# Patient Record
Sex: Female | Born: 1975 | Race: Black or African American | Hispanic: No | Marital: Married | Smoking: Never smoker
Health system: Southern US, Community
[De-identification: ages and names within clinical notes are randomized; demographics above are authoritative.]

## PROBLEM LIST (undated history)

## (undated) DIAGNOSIS — Z973 Presence of spectacles and contact lenses: Secondary | ICD-10-CM

## (undated) DIAGNOSIS — D649 Anemia, unspecified: Secondary | ICD-10-CM

## (undated) DIAGNOSIS — D259 Leiomyoma of uterus, unspecified: Secondary | ICD-10-CM

## (undated) HISTORY — PX: MOUTH SURGERY: SHX715

## (undated) HISTORY — PX: BREAST BIOPSY: SHX20

---

## 1998-05-06 ENCOUNTER — Other Ambulatory Visit: Admission: RE | Admit: 1998-05-06 | Discharge: 1998-05-06 | Payer: Self-pay | Admitting: Obstetrics and Gynecology

## 1999-07-08 ENCOUNTER — Other Ambulatory Visit: Admission: RE | Admit: 1999-07-08 | Discharge: 1999-07-08 | Payer: Self-pay | Admitting: Obstetrics and Gynecology

## 2001-02-01 ENCOUNTER — Other Ambulatory Visit: Admission: RE | Admit: 2001-02-01 | Discharge: 2001-02-01 | Payer: Self-pay | Admitting: Obstetrics and Gynecology

## 2002-02-06 ENCOUNTER — Other Ambulatory Visit: Admission: RE | Admit: 2002-02-06 | Discharge: 2002-02-06 | Payer: Self-pay | Admitting: Obstetrics and Gynecology

## 2003-01-15 ENCOUNTER — Inpatient Hospital Stay (HOSPITAL_COMMUNITY): Admission: AD | Admit: 2003-01-15 | Discharge: 2003-01-18 | Payer: Self-pay | Admitting: *Deleted

## 2003-02-19 ENCOUNTER — Other Ambulatory Visit: Admission: RE | Admit: 2003-02-19 | Discharge: 2003-02-19 | Payer: Self-pay | Admitting: *Deleted

## 2008-10-30 HISTORY — PX: ORIF ANKLE FRACTURE: SHX5408

## 2009-05-27 ENCOUNTER — Inpatient Hospital Stay (HOSPITAL_COMMUNITY): Admission: EM | Admit: 2009-05-27 | Discharge: 2009-05-29 | Payer: Self-pay | Admitting: Emergency Medicine

## 2010-06-22 ENCOUNTER — Encounter: Admission: RE | Admit: 2010-06-22 | Discharge: 2010-09-20 | Payer: Self-pay | Admitting: Family Medicine

## 2010-10-12 ENCOUNTER — Encounter: Admit: 2010-10-12 | Payer: Self-pay | Admitting: Family Medicine

## 2011-02-05 LAB — PREGNANCY, URINE: Preg Test, Ur: NEGATIVE

## 2011-02-05 LAB — CBC
HCT: 33.3 % — ABNORMAL LOW (ref 36.0–46.0)
MCHC: 34.3 g/dL (ref 30.0–36.0)
MCV: 84 fL (ref 78.0–100.0)
Platelets: 296 10*3/uL (ref 150–400)
Platelets: 366 10*3/uL (ref 150–400)
RBC: 3.97 MIL/uL (ref 3.87–5.11)
WBC: 10.7 10*3/uL — ABNORMAL HIGH (ref 4.0–10.5)
WBC: 13.7 10*3/uL — ABNORMAL HIGH (ref 4.0–10.5)

## 2011-02-05 LAB — URINALYSIS, ROUTINE W REFLEX MICROSCOPIC
Bilirubin Urine: NEGATIVE
Glucose, UA: NEGATIVE mg/dL
Ketones, ur: 15 mg/dL — AB
Nitrite: NEGATIVE

## 2011-02-05 LAB — URINE MICROSCOPIC-ADD ON

## 2011-02-05 LAB — BASIC METABOLIC PANEL
BUN: 10 mg/dL (ref 6–23)
Creatinine, Ser: 0.86 mg/dL (ref 0.4–1.2)
GFR calc non Af Amer: >60 mL/min (ref >60)
Potassium: 3.7 mEq/L (ref 3.5–5.1)

## 2011-02-05 LAB — DIFFERENTIAL
Lymphocytes Relative: 12 % (ref 12–46)
Lymphs Abs: 1.6 10*3/uL (ref 0.7–4.0)
Neutrophils Relative %: 82 % — ABNORMAL HIGH (ref 43–77)

## 2011-02-05 LAB — PROTIME-INR
INR: 1 (ref 0.00–1.49)
Prothrombin Time: 13.6 seconds (ref 11.6–15.2)

## 2011-02-05 LAB — URINE CULTURE

## 2011-03-14 NOTE — Op Note (Signed)
NAMEMarland Kitchen  Lisa Shepard, Lisa Shepard            ACCOUNT NO.:  [id number]   MEDICAL RECORD NO.:  [id number]          PATIENT TYPE:  OBV   LOCATION:  5023                         FACILITY:  [facility]   PHYSICIAN:  Alvy Beal, MD    DATE OF BIRTH:  01/30/1976   DATE OF PROCEDURE:  DATE OF DISCHARGE:                               OPERATIVE REPORT   PREOPERATIVE DIAGNOSIS:  Left bimalleolar ankle fracture.   POSTOPERATIVE DIAGNOSIS:  Left bimalleolar ankle fracture.   OPERATIVE PROCEDURE:  Open reduction and internal fixation of the ankle.   COMPLICATIONS:  None.   SURGEON:  Dahari D. Shon Baton, MD   FIRST ASSISTANT:  Crissie Reese, PA.   HISTORY:  This is a very pleasant 35 year old woman who fell yesterday  evening while roller derbing.  I admitted her for definitive fracture  fixation.  All appropriate risks, benefits, and alternatives were  discussed.  Consent was obtained.   OPERATIVE NOTE:  The patient was brought to the operating room, placed  supine on the operating table.  After successful induction of laryngeal  mask anesthesia and laryngeal mask intubation, a bump was placed under  the left thigh.  The tourniquet was placed on the left proximal thigh,  and the left lower extremity was prepped and draped in a standard  fashion.  Appropriate red time-out was done confirming patient,  procedure, extremity, and fracture.  Once this was confirmed, I then  exsanguinated the extremity with 6-inch wrap and then inflated the  tourniquet to 300 mmHg (total tourniquet time 57 minutes).  Lateral  incision was made over the distal in line of the fibula.  Sharp  dissection was carried out down to the deep subcutaneous tissue to the  underlying fascia.  I sharply dissected with a Metzenbaum scissors to  expose the lateral aspect of the fibula.  I was able to clearly  visualize the latter to be oblique fracture.  The fracture went from  posterosuperior to anteroinferior.  Once I had adequate  exposure  proximal to and distal to the fracture along the lateral aspect of the  fibula and I could visualize the anterior and posterior portions of the  fibula, I proceeded with placing of lag screw.   At this point, I extenuated the fracture fragment and used a fine  curette and dental hook to remove any entrapped hematoma and bony  spicules from the fracture site.  I used a #15 blade scalpel to remove  overlying enfolded periosteum.  At this point, I took a reduction clamp,  applied to the lateral aspect of the fibula.  I had an adequate  satisfactory reduction.   I confirmed the reduction with AP, mortise, and lateral x-ray views.  Once confirmed, I then took a 3.5 drill and drilled the proximal cortex.  I then placed a 3.5 drill through this hole and drilled through the  distal cortex.  I measured and took a 20-mm cortical screw and used as a  lag screw across this and I had excellent purchase.  I was able to  remove the reduction forceps and the fracture remained reduced.  I  again  checked the reduction, hardware placement in the AP, mortise, and  lateral planes.  Once confirmed, I then took a 7-hole one-third Synthes  tubular plate and secured it to the lateral aspect of the fibula and 2  distal screws that were cancellus unicortical screws and 3 proximal 14-  mm cortical screws.  I had excellent purchase.  All screws were  tightened appropriately.  I irrigated the wound copiously with normal  saline, and I then took final intraoperative fluoro views.  The patient  had satisfactory reduction of the lateral fragment and then indirectly  the posterior fragment reduced as well.  I stressed the fibula to test  the syndesmosis and it was intact and there was better reduction of the  medial clear space.  At this point with the reductions anatomic and the  hardware in satisfactory position, I irrigated copiously with normal  saline and then closed in a layered fashion with 0  interrupted Vicryl, 2-  0 interrupted Vicryl, and a 3-0 Prolene vertical mattress running suture  for the skin.  A bulky dressing was applied as was a posterior splint  with side struts.  The tourniquet was released.  Prior to applying  dressings, I did anesthetize the given intra-articular 5 mL plain  lidocaine injection and then injected just cranial to the incision.  Then the dressing and splint was applied.  The patient was extubated,  transferred to the PACU without incident.  At the end of the case, all  needle and sponge counts were correct.  The patient tolerated the  procedure well.  No intraoperative complications.      Alvy Beal, MD  Electronically Signed     DDB/MEDQ  D:  05/28/2009  T:  05/29/2009  Job:  [id number]

## 2011-03-14 NOTE — H&P (Signed)
NAMEMarland Shepard  Lisa, Shepard            ACCOUNT NO.:  [id number]   MEDICAL RECORD NO.:  [id number]          PATIENT TYPE:  OBV   LOCATION:  5023                         FACILITY:  [facility]   PHYSICIAN:  Alvy Beal, MD    DATE OF BIRTH:  [birth year]   DATE OF ADMISSION:  05/27/2009  DATE OF DISCHARGE:                              HISTORY & PHYSICAL   ADMITTING DIAGNOSIS:  Left ankle fracture (bimalleolar).   HISTORY:  This is a very pleasant active 35 year old woman, who is an  employed here at [employer].  She was in her usual  state of good-to-excellent health without significant medical issues  until she injured herself this evening.  She was at a rollerblading  competition, lost her footing and fell.  She noted immediate pain and  swelling of the left ankle.  She was brought to the emergency room here  at [facility] where x-rays were done, which demonstrated a bimalleolar  ankle fracture.  As a result, orthopedic consultation was requested.   The patient's past medical history is essentially unremarkable.   ALLERGIES:  She has adverse drug reaction to CODEINE, but otherwise, no  other allergies.   She denies a history of hypertension, diabetes, coronary artery disease.   She is on no current medications.   CLINICAL EXAM:  GENERAL:  She is a pleasant woman appears to her stated  age, in no acute distress.  She is alert and oriented x3.  No shortness  of breath or chest pain.  ABDOMEN:  Soft and nontender.  NEUROLOGIC:  Cranial nerves II through XII tested and they are intact.  EXTREMITIES:  Thigh compartments are soft, nontender.  Proximal calf is  soft and nontender.  She is moving all her toes.  Capillary refill less  than 2 seconds, and she has got intact sensation to light touch.  She  has a posterior splint with side struts applied, and the ankle was  comfortable, is currently elevated.   At this point in time, the x-rays do demonstrate a bimalleolar  ankle  fracture.  There is widening of the medial side, a small posterior  fragment and a Weber B lateral malleolar fracture.   I have had a frank and lengthy discussion with the patient, and we  discussed treatment options which include nonsurgical and surgical  management.  At this point I think because of the medial widening, the  best option is to fix the lateral side and if required after stressing,  a syndesmotic screw.  I went over the risks with her which include  infection, bleeding, nerve damage, death, stroke, paralysis, failure to  heal, need for further surgery, ongoing or worse pain, arthritis.  All  of her questions were addressed.  We will plan on performing surgery  tomorrow.  She is currently n.p.o., on IV fluids.      Alvy Beal, MD  Electronically Signed     DDB/MEDQ  D:  05/28/2009  T:  05/28/2009  Job:  [id number]

## 2013-10-30 HISTORY — PX: BREAST EXCISIONAL BIOPSY: SUR124

## 2014-02-02 ENCOUNTER — Other Ambulatory Visit: Payer: Self-pay | Admitting: Family

## 2014-02-02 DIAGNOSIS — Z1231 Encounter for screening mammogram for malignant neoplasm of breast: Secondary | ICD-10-CM

## 2014-02-11 ENCOUNTER — Other Ambulatory Visit: Payer: Self-pay | Admitting: Family

## 2014-02-11 ENCOUNTER — Ambulatory Visit
Admission: RE | Admit: 2014-02-11 | Discharge: 2014-02-11 | Disposition: A | Discharge status: Home / Self Care | Payer: BC Managed Care – PPO | Source: Ambulatory Visit | Attending: Family | Admitting: Family

## 2014-02-11 DIAGNOSIS — Z1231 Encounter for screening mammogram for malignant neoplasm of breast: Secondary | ICD-10-CM

## 2014-02-17 ENCOUNTER — Other Ambulatory Visit: Payer: Self-pay | Admitting: Family

## 2014-02-17 DIAGNOSIS — R928 Other abnormal and inconclusive findings on diagnostic imaging of breast: Secondary | ICD-10-CM

## 2014-02-26 ENCOUNTER — Ambulatory Visit
Admission: RE | Admit: 2014-02-26 | Discharge: 2014-02-26 | Disposition: A | Discharge status: Home / Self Care | Payer: BC Managed Care – PPO | Source: Ambulatory Visit | Attending: Family | Admitting: Family

## 2014-02-26 ENCOUNTER — Encounter (INDEPENDENT_AMBULATORY_CARE_PROVIDER_SITE_OTHER): Payer: Self-pay

## 2014-02-26 DIAGNOSIS — R928 Other abnormal and inconclusive findings on diagnostic imaging of breast: Secondary | ICD-10-CM

## 2014-07-24 ENCOUNTER — Other Ambulatory Visit: Payer: Self-pay | Admitting: Family

## 2014-07-24 DIAGNOSIS — N6489 Other specified disorders of breast: Secondary | ICD-10-CM

## 2014-08-21 ENCOUNTER — Ambulatory Visit
Admission: RE | Admit: 2014-08-21 | Discharge: 2014-08-21 | Disposition: A | Discharge status: Home / Self Care | Payer: BC Managed Care – PPO | Source: Ambulatory Visit | Attending: Family | Admitting: Family

## 2014-08-21 ENCOUNTER — Other Ambulatory Visit: Payer: Self-pay | Admitting: Family

## 2014-08-21 ENCOUNTER — Encounter (INDEPENDENT_AMBULATORY_CARE_PROVIDER_SITE_OTHER): Payer: Self-pay

## 2014-08-21 DIAGNOSIS — N6489 Other specified disorders of breast: Secondary | ICD-10-CM

## 2014-08-31 ENCOUNTER — Other Ambulatory Visit: Payer: Self-pay | Admitting: Family

## 2014-08-31 DIAGNOSIS — N6489 Other specified disorders of breast: Secondary | ICD-10-CM

## 2014-09-02 ENCOUNTER — Ambulatory Visit
Admission: RE | Admit: 2014-09-02 | Discharge: 2014-09-02 | Disposition: A | Discharge status: Home / Self Care | Payer: BC Managed Care – PPO | Source: Ambulatory Visit | Attending: Family | Admitting: Family

## 2014-09-02 DIAGNOSIS — N6489 Other specified disorders of breast: Secondary | ICD-10-CM

## 2014-09-15 ENCOUNTER — Ambulatory Visit (INDEPENDENT_AMBULATORY_CARE_PROVIDER_SITE_OTHER): Payer: Self-pay | Admitting: Surgery

## 2014-09-15 NOTE — H&P (Signed)
Lisa Shepard 09/15/2014 11:17 AM Location: [facility] Patient #: [id number] DOB: Sep 19, 1976 Divorced / Language: [name] / Race: Black or African American Female History of Present Illness [name] A. Ayad Nieman MD; 09/15/2014 12:26 PM) Patient words: eval left breast  pt sent at the request of Dr Joneen Caraway of the [facility] for left breast mass and discordant mammogram. Pt has has left subareolar mass for last year. This has been followed and was recently biopsied. PATH SHOWED FIBROCYSTIC DISEASE BUT THIS WAS FELT TO BE DISCORDANT.  CLINICAL DATA: Short-term (6 month) interval followup of likely benign complex cyst in the subareolar left breast and focal asymmetry in the outer left breast.  EXAM: DIGITAL DIAGNOSTIC LEFT MAMMOGRAM WITH CAD  ULTRASOUND LEFT BREAST  COMPARISON: Mammography 02/26/2014 (left), 02/11/2014 (bilateral. Left breast ultrasound 02/26/2014.  ACR Breast Density Category c: The breast tissue is heterogeneously dense, which may obscure small masses.  FINDINGS: CC and MLO views of the left breast were obtained. The focal asymmetry in the outer left breast, middle depth, visualized only on the CC view is unchanged. The diagnostic mammogram 6 months ago indicated this just represented an [place] of fibroglandular tissue. The circumscribed, partially obscured mass in the subareolar left breast is unchanged in appearance. No new or suspicious findings elsewhere in the left breast.  Mammographic images were processed with CAD.  On physical exam, there is no palpable abnormality in the periareolar left breast.  Ultrasound is performed, showing the previously identified oval-shaped circumscribed hypoechoic mass with acoustic enhancement at the 9 o'clock subareolar left breast measuring approximately 6 x 4 x 6 mm, without internal color Doppler flow. At the 7 o'clock subareolar left breast is a horizontally oriented hypoechoic mass with lobular margins  measuring approximately 7 x 4 x 6 mm, with acoustic enhancement, demonstrating internal color Doppler flow.  IMPRESSION: 1. Approximate 7 mm mass at the 7 o'clock subareolar left breast, not visualized on the prior ultrasound, demonstrating internal color Doppler flow. Differential diagnosis would include intraductal papilloma, fibroadenoma, and less likely malignancy. 2. Stable likely benign 6 mm complex cyst at the 9 o'clock subareolar left breast. 3. Focal asymmetry in the outer left breast, middle depth, felt to represent an [place] of normal fibroglandular tissue.  RECOMMENDATION: 1. Ultrasound-guided core needle biopsy of the mass at the 7 o'clock subareolar left breast. 2. Bilateral diagnostic mammography with left breast ultrasound in 6 months to confirm stability of the likely benign complex cyst at the 9 o'clock subareolar left breast.  I discussed with the patient the fact that if this is indeed an intraductal papilloma, it would require surgical excision as it is considered a high risk lesion. The ultrasound biopsy procedure was discussed in detail with the patient. All of her questions were answered. She has agreed to proceed and at her request, the biopsy has been scheduled for November 4.  I have discussed the findings and recommendations with the patient. Results were also provided in writing at the conclusion of the visit.  BI-RADS CATEGORY 4: Suspicious.   Electronically Signed By: Evangeline Dakin M.D. On: 08/21/2014 08:40    Pathology revealed fibrocystic changes in the left breast at 7:00. This was found to be discordant with imaging by Dr. Enrique Sack. Pathology was discussed with the patient by telephone. She reported doing well after the biopsy with tenderness at the site. Post biopsy instructions were reviewed and her questions were answered. She was encouraged to call [facility] [place] for any additional concerns. Surgical  consultation has been arranged with Dr. Erroll Luna at [facility] on September 15, 2014. The patient is also interested in having a complex cyst removed in the left breast at 9:00. She is asked to return in 6 months for left breast ultrasound.  Pathology results reported by Susa Raring RN, BSN on September 03, 2014.   Electronically Signed By: Enrique Sack M.D. On: 09/03/2014 11:01   Breast, left, needle core biopsy, mass, 7 o'clock retroareolar - FIBROCYSTIC CHANGES. - THERE IS NO EVIDENCE OF MALIGNANCY. - SEE COMMENT.  The patient is a 38 year old female who presents with a breast mass. The patient is being seen for initial consultation for suspected neoplasm in the left breast that was first noted 12 month(s) ago. The patient was referred by a specialty consultant. Initial presentation was 12 month(s) ago with breast mass on self-examination. Evaluation to date has included mammography, BREAST ultrasound and core needle biopsy. Findings on mammography include an irregular mass. Classification based on mammographic findings is BiRads Class IV (suspicious). Ultrasound shows a complex mass. Cytology/histopathology is nondiagnostic. Other Problems Marjean Donna, CMA; 09/15/2014 11:50 AM) No pertinent past medical history  Past Surgical History Marjean Donna, [place]; 09/15/2014 11:50 AM) Breast Biopsy Left. Foot Surgery Left.  Diagnostic Studies History Marjean Donna, [place]; 09/15/2014 11:50 AM) Colonoscopy never Mammogram within last year Pap Smear 1-5 years ago  Allergies [name], [place]; 09/15/2014 11:50 AM) No Known Drug Allergies 09/15/2014  Medication History (Sonya Bynum, CMA; 09/15/2014 11:50 AM) No Current Medications  Social History Marjean Donna, [place]; 09/15/2014 11:50 AM) Alcohol use Occasional alcohol use. Caffeine use Carbonated beverages, Coffee. No drug use Tobacco use Never smoker.  Family History Marjean Donna, [place];  09/15/2014 11:50 AM) Cerebrovascular Accident Father. Colon Cancer Father. Diabetes Mellitus Father. Hypertension Father, Mother. Thyroid problems Father.  Pregnancy / Birth History Marjean Donna, [place]; 09/15/2014 11:50 AM) Age at menarche 45 years. Contraceptive History Contraceptive implant. Gravida 1 Maternal age 73-30 Para 1 Regular periods     Review of Systems ([place]; 09/15/2014 11:50 AM) General Not Present- Appetite Loss, Chills, Fatigue, Fever, Night Sweats, Weight Gain and Weight Loss. Skin Not Present- Change in Wart/Mole, Dryness, Hives, Jaundice, New Lesions, Non-Healing Wounds, Rash and Ulcer. HEENT Not Present- Earache, Hearing Loss, Hoarseness, Nose Bleed, Oral Ulcers, Ringing in the Ears, Seasonal Allergies, Sinus Pain, Sore Throat, Visual Disturbances, Wears glasses/contact lenses and Yellow Eyes. Respiratory Not Present- Bloody sputum, Chronic Cough, Difficulty Breathing, Snoring and Wheezing. Breast Present- Breast Mass. Not Present- Breast Pain, Nipple Discharge and Skin Changes. Cardiovascular Not Present- Chest Pain, Difficulty Breathing Lying Down, Leg Cramps, Palpitations, Rapid Heart Rate, Shortness of Breath and Swelling of Extremities. Gastrointestinal Not Present- Abdominal Pain, Bloating, Bloody Stool, Change in Bowel Habits, Chronic diarrhea, Constipation, Difficulty Swallowing, Excessive gas, Gets full quickly at meals, Hemorrhoids, Indigestion, Nausea, Rectal Pain and Vomiting. Female Genitourinary Not Present- Frequency, Nocturia, Painful Urination, Pelvic Pain and Urgency. Musculoskeletal Not Present- Back Pain, Joint Pain, Joint Stiffness, Muscle Pain, Muscle Weakness and Swelling of Extremities. Neurological Present- Headaches. Not Present- Decreased Memory, Fainting, Numbness, Seizures, Tingling, Tremor, Trouble walking and Weakness. Psychiatric Not Present- Anxiety, Bipolar, Change in Sleep Pattern, Depression, Fearful and  Frequent crying. Endocrine Not Present- Cold Intolerance, Excessive Hunger, Hair Changes, Heat Intolerance, Hot flashes and New Diabetes. Hematology Not Present- Easy Bruising, Excessive bleeding, Gland problems, HIV and Persistent Infections.  Vitals (Sonya Bynum CMA; 09/15/2014 11:52 AM) 09/15/2014 11:51 AM Weight: 181 lb Height: 59in Body Surface  Area: 1.85 m Body Mass Index: 36.56 kg/m Temp.: 49F(Temporal)  Pulse: 79 (Regular)  BP: 130/70 (Sitting, Left Arm, Standard)     Physical Exam ([place] Baris A. Jermika Olden MD; 09/15/2014 12:25 PM)  General Mental Status-Alert. General Appearance-Consistent with stated age. Hydration-Well hydrated. Voice-Normal.  Head and Neck Head-normocephalic, atraumatic with no lesions or palpable masses. Trachea-midline. Thyroid Gland Characteristics - normal size and consistency.  Eye Eyeball - Bilateral-Extraocular movements intact. Sclera/Conjunctiva - Bilateral-No scleral icterus.  Breast Breast Lump Left Central - Size - 3 cm (Height) and 2 mm (Width). Consistency - Firm. Mobility - Mobile.  Cardiovascular Cardiovascular examination reveals -normal heart sounds, regular rate and rhythm with no murmurs and normal pedal pulses bilaterally.  Neurologic Neurologic evaluation reveals -alert and oriented x 3 with no impairment of recent or remote memory. Mental Status-Normal.  Musculoskeletal Normal Exam - Left-Upper Extremity Strength Normal and Lower Extremity Strength Normal. Normal Exam - Right-Upper Extremity Strength Normal and Lower Extremity Strength Normal.  Lymphatic Head & Neck  General Head & Neck Lymphatics: Bilateral - Description - Normal. Axillary  General Axillary Region: Bilateral - Description - Normal. Tenderness - Non Tender. Femoral & Inguinal  Generalized Femoral & Inguinal Lymphatics: Bilateral - Description - Normal. Tenderness - Non Tender.    Assessment & Plan (Corleone Biegler  A. Jamelyn Bovard MD; 09/15/2014 12:18 PM)  LEFT BREAST MASS (611.72  N63) Impression: pathology shows fibrocystic disease and this is discordant. Recommend leftbreast seed localized lumpectomy. Risk of lumpectomy include bleeding, infection, seroma, more surgery, use of seed/wire, wound care, cosmetic deformity and the need for other treatments, death , blood clots, death. Pt agrees to proceed.  MASS OF LEFT BREAST ON MAMMOGRAM (611.72  N63)  Current Plans Pt Education - [facility] Breast Biopsy Instructions (FLB): discussed with patient and provided information.

## 2014-09-21 ENCOUNTER — Other Ambulatory Visit (INDEPENDENT_AMBULATORY_CARE_PROVIDER_SITE_OTHER): Payer: Self-pay | Admitting: Surgery

## 2014-09-21 DIAGNOSIS — N6320 Unspecified lump in the left breast, unspecified quadrant: Secondary | ICD-10-CM

## 2014-09-22 ENCOUNTER — Encounter (HOSPITAL_BASED_OUTPATIENT_CLINIC_OR_DEPARTMENT_OTHER): Payer: Self-pay | Admitting: *Deleted

## 2014-09-22 NOTE — Progress Notes (Signed)
No labs needed

## 2014-09-30 ENCOUNTER — Ambulatory Visit
Admission: RE | Admit: 2014-09-30 | Discharge: 2014-09-30 | Disposition: A | Discharge status: Home / Self Care | Payer: BC Managed Care – PPO | Source: Ambulatory Visit | Attending: Surgery | Admitting: Surgery

## 2014-09-30 ENCOUNTER — Ambulatory Visit (HOSPITAL_BASED_OUTPATIENT_CLINIC_OR_DEPARTMENT_OTHER)
Admission: RE | Admit: 2014-09-30 | Discharge: 2014-09-30 | Disposition: A | Discharge status: Home / Self Care | Payer: BC Managed Care – PPO | Source: Ambulatory Visit | Attending: Surgery | Admitting: Surgery

## 2014-09-30 ENCOUNTER — Encounter (HOSPITAL_BASED_OUTPATIENT_CLINIC_OR_DEPARTMENT_OTHER): Payer: Self-pay | Admitting: *Deleted

## 2014-09-30 ENCOUNTER — Encounter (HOSPITAL_BASED_OUTPATIENT_CLINIC_OR_DEPARTMENT_OTHER)
Admission: RE | Disposition: A | Discharge status: Home / Self Care | Payer: Self-pay | Source: Ambulatory Visit | Attending: Surgery

## 2014-09-30 ENCOUNTER — Ambulatory Visit (HOSPITAL_BASED_OUTPATIENT_CLINIC_OR_DEPARTMENT_OTHER): Payer: BC Managed Care – PPO | Admitting: Anesthesiology

## 2014-09-30 DIAGNOSIS — Z6836 Body mass index (BMI) 36.0-36.9, adult: Secondary | ICD-10-CM | POA: Insufficient documentation

## 2014-09-30 DIAGNOSIS — N6320 Unspecified lump in the left breast, unspecified quadrant: Secondary | ICD-10-CM

## 2014-09-30 DIAGNOSIS — N63 Unspecified lump in breast: Secondary | ICD-10-CM | POA: Diagnosis not present

## 2014-09-30 DIAGNOSIS — I2510 Atherosclerotic heart disease of native coronary artery without angina pectoris: Secondary | ICD-10-CM | POA: Insufficient documentation

## 2014-09-30 HISTORY — DX: Anemia, unspecified: D64.9

## 2014-09-30 HISTORY — PX: BREAST LUMPECTOMY WITH RADIOACTIVE SEED LOCALIZATION: SHX6424

## 2014-09-30 HISTORY — DX: Presence of spectacles and contact lenses: Z97.3

## 2014-09-30 LAB — POCT HEMOGLOBIN-HEMACUE: HEMOGLOBIN: 10.8 g/dL — AB (ref 12.0–15.0)

## 2014-09-30 SURGERY — BREAST LUMPECTOMY WITH RADIOACTIVE SEED LOCALIZATION
Anesthesia: General | Site: Breast | Laterality: Left

## 2014-09-30 MED ORDER — MIDAZOLAM HCL 2 MG/2ML IJ SOLN
INTRAMUSCULAR | Status: AC
  Filled 2014-09-30: qty 2

## 2014-09-30 MED ORDER — CEFAZOLIN SODIUM-DEXTROSE 2-3 GM-% IV SOLR
INTRAVENOUS | Status: AC
  Filled 2014-09-30: qty 50

## 2014-09-30 MED ORDER — DEXAMETHASONE SODIUM PHOSPHATE 4 MG/ML IJ SOLN
INTRAMUSCULAR | Status: DC | PRN
  Administered 2014-09-30: 10 mg via INTRAVENOUS

## 2014-09-30 MED ORDER — HYDROMORPHONE HCL 1 MG/ML IJ SOLN
0.2500 mg | INTRAMUSCULAR | Status: DC | PRN
  Administered 2014-09-30: 0.5 mg via INTRAVENOUS

## 2014-09-30 MED ORDER — MIDAZOLAM HCL 5 MG/5ML IJ SOLN
INTRAMUSCULAR | Status: DC | PRN
  Administered 2014-09-30: 2 mg via INTRAVENOUS

## 2014-09-30 MED ORDER — BUPIVACAINE-EPINEPHRINE (PF) 0.25% -1:200000 IJ SOLN
INTRAMUSCULAR | Status: DC | PRN
  Administered 2014-09-30: 10 mL via PERINEURAL

## 2014-09-30 MED ORDER — PROPOFOL 10 MG/ML IV EMUL
INTRAVENOUS | Status: AC
  Filled 2014-09-30: qty 50

## 2014-09-30 MED ORDER — MIDAZOLAM HCL 2 MG/2ML IJ SOLN
1.0000 mg | INTRAMUSCULAR | Status: DC | PRN
Start: 2014-09-30 — End: 2014-09-30

## 2014-09-30 MED ORDER — DEXTROSE 5 % IV SOLN: 3.0000 g | INTRAVENOUS | Status: DC

## 2014-09-30 MED ORDER — LACTATED RINGERS IV SOLN
INTRAVENOUS | Status: DC
  Administered 2014-09-30 (×2): via INTRAVENOUS

## 2014-09-30 MED ORDER — OXYCODONE HCL 5 MG/5ML PO SOLN: 5.0000 mg | Freq: Once | ORAL | Status: DC | PRN

## 2014-09-30 MED ORDER — TRAMADOL HCL 50 MG PO TABS: 50.0000 mg | ORAL_TABLET | Freq: Four times a day (QID) | ORAL | Status: DC | PRN

## 2014-09-30 MED ORDER — ONDANSETRON HCL 4 MG/2ML IJ SOLN: 4.0000 mg | Freq: Four times a day (QID) | INTRAMUSCULAR | Status: DC | PRN

## 2014-09-30 MED ORDER — FENTANYL CITRATE 0.05 MG/ML IJ SOLN: 50.0000 ug | INTRAMUSCULAR | Status: DC | PRN

## 2014-09-30 MED ORDER — PROPOFOL 10 MG/ML IV BOLUS
INTRAVENOUS | Status: DC | PRN
  Administered 2014-09-30: 200 mg via INTRAVENOUS

## 2014-09-30 MED ORDER — LIDOCAINE HCL (CARDIAC) 20 MG/ML IV SOLN
INTRAVENOUS | Status: DC | PRN
  Administered 2014-09-30: 50 mg via INTRAVENOUS

## 2014-09-30 MED ORDER — FENTANYL CITRATE 0.05 MG/ML IJ SOLN
INTRAMUSCULAR | Status: AC
  Filled 2014-09-30: qty 6

## 2014-09-30 MED ORDER — FENTANYL CITRATE 0.05 MG/ML IJ SOLN
INTRAMUSCULAR | Status: DC | PRN
  Administered 2014-09-30: 100 ug via INTRAVENOUS

## 2014-09-30 MED ORDER — OXYCODONE HCL 5 MG PO TABS: 5.0000 mg | ORAL_TABLET | Freq: Once | ORAL | Status: DC | PRN

## 2014-09-30 MED ORDER — SUCCINYLCHOLINE CHLORIDE 20 MG/ML IJ SOLN
INTRAMUSCULAR | Status: AC
  Filled 2014-09-30: qty 2

## 2014-09-30 MED ORDER — ONDANSETRON HCL 4 MG/2ML IJ SOLN
INTRAMUSCULAR | Status: DC | PRN
  Administered 2014-09-30: 4 mg via INTRAVENOUS

## 2014-09-30 MED ORDER — HYDROMORPHONE HCL 1 MG/ML IJ SOLN
INTRAMUSCULAR | Status: AC
  Filled 2014-09-30: qty 1

## 2014-09-30 SURGICAL SUPPLY — 49 items
APPLIER CLIP 9.375 MED OPEN (MISCELLANEOUS)
BINDER BREAST LRG (GAUZE/BANDAGES/DRESSINGS) IMPLANT
BINDER BREAST MEDIUM (GAUZE/BANDAGES/DRESSINGS) IMPLANT
BINDER BREAST XLRG (GAUZE/BANDAGES/DRESSINGS) ×2 IMPLANT
BINDER BREAST XXLRG (GAUZE/BANDAGES/DRESSINGS) IMPLANT
BLADE SURG 15 STRL LF DISP TIS (BLADE) ×1 IMPLANT
BLADE SURG 15 STRL SS (BLADE) ×1
CANISTER SUC SOCK COL 7IN (MISCELLANEOUS) ×2 IMPLANT
CANISTER SUCT 1200ML W/VALVE (MISCELLANEOUS) IMPLANT
CHLORAPREP W/TINT 26ML (MISCELLANEOUS) ×2 IMPLANT
CLIP APPLIE 9.375 MED OPEN (MISCELLANEOUS) IMPLANT
CLIP TI WIDE RED SMALL 6 (CLIP) ×2 IMPLANT
COVER BACK TABLE 60X90IN (DRAPES) ×2 IMPLANT
COVER MAYO STAND STRL (DRAPES) ×2 IMPLANT
COVER PROBE W GEL 5X96 (DRAPES) ×2 IMPLANT
DECANTER SPIKE VIAL GLASS SM (MISCELLANEOUS) IMPLANT
DEVICE DUBIN W/COMP PLATE 8390 (MISCELLANEOUS) ×2 IMPLANT
DRAPE LAPAROSCOPIC ABDOMINAL (DRAPES) IMPLANT
DRAPE PED LAPAROTOMY (DRAPES) ×2 IMPLANT
DRAPE UTILITY XL STRL (DRAPES) ×2 IMPLANT
ELECT COATED BLADE 2.86 ST (ELECTRODE) ×2 IMPLANT
ELECT REM PT RETURN 9FT ADLT (ELECTROSURGICAL) ×2
ELECTRODE REM PT RTRN 9FT ADLT (ELECTROSURGICAL) ×1 IMPLANT
GLOVE BIOGEL PI IND STRL 7.0 (GLOVE) ×1 IMPLANT
GLOVE BIOGEL PI IND STRL 8 (GLOVE) ×1 IMPLANT
GLOVE BIOGEL PI INDICATOR 7.0 (GLOVE) ×1
GLOVE BIOGEL PI INDICATOR 8 (GLOVE) ×1
GLOVE ECLIPSE 6.5 STRL STRAW (GLOVE) ×2 IMPLANT
GLOVE ECLIPSE 8.0 STRL XLNG CF (GLOVE) ×2 IMPLANT
GOWN STRL REUS W/ TWL LRG LVL3 (GOWN DISPOSABLE) ×2 IMPLANT
GOWN STRL REUS W/TWL LRG LVL3 (GOWN DISPOSABLE) ×2
KIT MARKER MARGIN INK (KITS) ×2 IMPLANT
LIQUID BAND (GAUZE/BANDAGES/DRESSINGS) ×2 IMPLANT
NEEDLE HYPO 25X1 1.5 SAFETY (NEEDLE) ×2 IMPLANT
NS IRRIG 1000ML POUR BTL (IV SOLUTION) ×2 IMPLANT
PACK BASIN DAY SURGERY FS (CUSTOM PROCEDURE TRAY) ×2 IMPLANT
PENCIL BUTTON HOLSTER BLD 10FT (ELECTRODE) ×2 IMPLANT
SLEEVE SCD COMPRESS KNEE MED (MISCELLANEOUS) ×2 IMPLANT
SPONGE LAP 4X18 X RAY DECT (DISPOSABLE) ×4 IMPLANT
STAPLER VISISTAT 35W (STAPLE) IMPLANT
SUT MNCRL AB 4-0 PS2 18 (SUTURE) ×2 IMPLANT
SUT SILK 2 0 SH (SUTURE) IMPLANT
SUT VIC AB 3-0 SH 27 (SUTURE) ×1
SUT VIC AB 3-0 SH 27X BRD (SUTURE) ×1 IMPLANT
SYR CONTROL 10ML LL (SYRINGE) ×2 IMPLANT
TOWEL OR 17X24 6PK STRL BLUE (TOWEL DISPOSABLE) ×2 IMPLANT
TOWEL OR NON WOVEN STRL DISP B (DISPOSABLE) ×2 IMPLANT
TUBE CONNECTING 20X1/4 (TUBING) IMPLANT
YANKAUER SUCT BULB TIP NO VENT (SUCTIONS) ×2 IMPLANT

## 2014-09-30 NOTE — Op Note (Signed)
eoperative diagnosis: Left breast discordant mass  Postoperative diagnosis: Same   Procedure: Left breast seed localized lumpectomy  Surgeon: Erroll Luna M.D.  Anesthesia: Gen. With 0.25% Sensorcaine local  EBL: 20 cc  Specimen: Left breast tissue with clip and radioactive seed in the specimen. Verified with neoprobe and radiographic image showing both seed and clip in specimen  Indications for procedure: The patient presents for left breast excisional lumpectomy after core biopsy showed discordant mass. Discussed the rationale for considering excision. Small risk of malignancy associated with papilloma lesion after core biopsy. Discussed observation. Discussed wire localization. Patient desired excision of left breast papilloma.The procedure has been discussed with the patient. Alternatives to surgery have been discussed with the patient.  Risks of surgery include bleeding,  Infection,  Seroma formation, death,  and the need for further surgery.   The patient understands and wishes to proceed.   Description of procedure: Patient underwent seed placement as an outpatient. Patient presents today for left breast seed localized lumpectomy. Patient and holding area. Questions are answered and neoprobe used to verify seed location. Patient taken back to the operating room and placed upon the OR table. After induction of general anesthesia, left breast prepped and draped in a sterile fashion. Timeout was done to verify proper sizing procedure. Neoprobe used and hot spot identified and left breast upper-outer quadrant. This was marked with pen. Curvilinear incision made at NAC inferior border. . Dissection used with the help of a neoprobe around the tissue where the seed and clip were located. Tissue removed in its entirety with gross margins.Marlis Edelson used and seen within specimen. Radiographs taken which show clip and seed  In specimen.hemostasis achieved and cavity closed with 3-0 Vicryl and 4-0  Monocryl. Dermabond applied. All final counts found to be correct. Specimen transported to pathology. Patient awoke extubated taken to recovery in satisfactory condition.

## 2014-09-30 NOTE — Transfer of Care (Signed)
Immediate Anesthesia Transfer of Care Note  Patient: Lisa Shepard  Procedure(s) Performed: Procedure(s): LEFT BREAST LUMPECTOMY WITH RADIOACTIVE SEED LOCALIZATION (Left)  Patient Location: PACU  Anesthesia Type:General  Level of Consciousness: sedated and patient cooperative  Airway & Oxygen Therapy: Patient Spontanous Breathing and Patient connected to face mask oxygen  Post-op Assessment: Report given to PACU RN and Post -op Vital signs reviewed and stable  Post vital signs: Reviewed and stable  Complications: No apparent anesthesia complications

## 2014-09-30 NOTE — Discharge Instructions (Addendum)
[place] Office Phone Number [phone]  BREAST BIOPSY/LUMPECTOMY: POST OP INSTRUCTIONS  Always review your discharge instruction sheet given to you by the facility where your surgery was performed.  IF YOU HAVE DISABILITY OR FAMILY LEAVE FORMS, YOU MUST BRING THEM TO THE OFFICE FOR PROCESSING.  DO NOT GIVE THEM TO YOUR DOCTOR.  1. A prescription for pain medication may be given to you upon discharge.  Take your pain medication as prescribed, if needed.  If narcotic pain medicine is not needed, then you may take acetaminophen (Tylenol) or ibuprofen (Advil) as needed. 2. Take your usually prescribed medications unless otherwise directed 3. If you need a refill on your pain medication, please contact your pharmacy.  They will contact our office to request authorization.  Prescriptions will not be filled after 5pm or on week-ends. 4. You should eat very light the first 24 hours after surgery, such as soup, crackers, pudding, etc.  Resume your normal diet the day after surgery. 5. Most patients will experience some swelling and bruising in the breast.  Ice packs and a good support bra will help.  Swelling and bruising can take several days to resolve.  6. It is common to experience some constipation if taking pain medication after surgery.  Increasing fluid intake and taking a stool softener will usually help or prevent this problem from occurring.  A mild laxative (Milk of Magnesia or Miralax) should be taken according to package directions if there are no bowel movements after 48 hours. 7. Unless discharge instructions indicate otherwise, you may remove your bandages 24-48 hours after surgery, and you may shower at that time.  You may have steri-strips (small skin tapes) in place directly over the incision.  These strips should be left on the skin for 7-10 days.  If your surgeon used skin glue on the incision, you may shower in 24 hours.  The glue will flake off over the next 2-3  weeks.  Any sutures or staples will be removed at the office during your follow-up visit. 8. ACTIVITIES:  You may resume regular daily activities (gradually increasing) beginning the next day.  Wearing a good support bra or sports bra minimizes pain and swelling.  You may have sexual intercourse when it is comfortable. a. You may drive when you no longer are taking prescription pain medication, you can comfortably wear a seatbelt, and you can safely maneuver your car and apply brakes. b. RETURN TO WORK:  ______________________________________________________________________________________ 9. You should see your doctor in the office for a follow-up appointment approximately two weeks after your surgery.  Your doctors nurse will typically make your follow-up appointment when she calls you with your pathology report.  Expect your pathology report 2-3 business days after your surgery.  You may call to check if you do not hear from [place] after three days. OTHER INSTRUCTIONS: _______________________________________________________________________________________________   WHEN TO CALL YOUR DOCTOR: 1. Fever over 101.0 2. Nausea and/or vomiting. 3. Extreme swelling or bruising. 4. Continued bleeding from incision. 5. Increased pain, redness, or drainage from the incision.  The clinic staff is available to answer your questions during regular business hours.  Please dont hesitate to call and ask to speak to one of the nurses for clinical concerns.  If you have a medical emergency, go to the nearest emergency room or call 911.  A surgeon from [facility] is always on call at the hospital.    Post Anesthesia Home Care Instructions  Activity: Get plenty of rest  for the remainder of the day. A responsible adult should stay with you for 24 hours following the procedure.  For the next 24 hours, DO NOT: -Drive a car -Paediatric nurse -Drink alcoholic beverages -Take any medication unless  instructed by your physician -Make any legal decisions or sign important papers.  Meals: Start with liquid foods such as gelatin or soup. Progress to regular foods as tolerated. Avoid greasy, spicy, heavy foods. If nausea and/or vomiting occur, drink only clear liquids until the nausea and/or vomiting subsides. Call your physician if vomiting continues.  Special Instructions/Symptoms: Your throat may feel dry or sore from the anesthesia or the breathing tube placed in your throat during surgery. If this causes discomfort, gargle with warm salt water. The discomfort should disappear within 24 hours.

## 2014-09-30 NOTE — Anesthesia Postprocedure Evaluation (Signed)
  Anesthesia Post-op Note  Patient: Lisa Shepard  Procedure(s) Performed: Procedure(s): LEFT BREAST LUMPECTOMY WITH RADIOACTIVE SEED LOCALIZATION (Left)  Patient Location: PACU  Anesthesia Type: General   Level of Consciousness: awake, alert  and oriented  Airway and Oxygen Therapy: Patient Spontanous Breathing  Post-op Pain: mild  Post-op Assessment: Post-op Vital signs reviewed  Post-op Vital Signs: Reviewed  Last Vitals:  Filed Vitals:   09/30/14 1356  BP: 137/88  Pulse: 97  Temp: 36.4 C  Resp: 16    Complications: No apparent anesthesia complications

## 2014-09-30 NOTE — H&P (View-Only) (Signed)
Lisa Shepard 09/15/2014 11:17 AM Location: [facility] Patient #: [id number] DOB: 09-25-1976 Divorced / Language: Lisa Shepard / Race: Black or African American Female History of Present Illness [name] A. [name] MD; 09/15/2014 12:26 PM) Patient words: eval left breast  pt sent at the request of Dr Joneen Caraway of the [facility] for left breast mass and discordant mammogram. Pt has has left subareolar mass for last year. This has been followed and was recently biopsied. PATH SHOWED FIBROCYSTIC DISEASE BUT THIS WAS FELT TO BE DISCORDANT.  CLINICAL DATA: Short-term (6 month) interval followup of likely benign complex cyst in the subareolar left breast and focal asymmetry in the outer left breast.  EXAM: DIGITAL DIAGNOSTIC LEFT MAMMOGRAM WITH CAD  ULTRASOUND LEFT BREAST  COMPARISON: Mammography 02/26/2014 (left), 02/11/2014 (bilateral. Left breast ultrasound 02/26/2014.  ACR Breast Density Category c: The breast tissue is heterogeneously dense, which may obscure small masses.  FINDINGS: CC and MLO views of the left breast were obtained. The focal asymmetry in the outer left breast, middle depth, visualized only on the CC view is unchanged. The diagnostic mammogram 6 months ago indicated this just represented an [place] of fibroglandular tissue. The circumscribed, partially obscured mass in the subareolar left breast is unchanged in appearance. No new or suspicious findings elsewhere in the left breast.  Mammographic images were processed with CAD.  On physical exam, there is no palpable abnormality in the periareolar left breast.  Ultrasound is performed, showing the previously identified oval-shaped circumscribed hypoechoic mass with acoustic enhancement at the 9 o'clock subareolar left breast measuring approximately 6 x 4 x 6 mm, without internal color Doppler flow. At the 7 o'clock subareolar left breast is a horizontally oriented hypoechoic mass with lobular margins  measuring approximately 7 x 4 x 6 mm, with acoustic enhancement, demonstrating internal color Doppler flow.  IMPRESSION: 1. Approximate 7 mm mass at the 7 o'clock subareolar left breast, not visualized on the prior ultrasound, demonstrating internal color Doppler flow. Differential diagnosis would include intraductal papilloma, fibroadenoma, and less likely malignancy. 2. Stable likely benign 6 mm complex cyst at the 9 o'clock subareolar left breast. 3. Focal asymmetry in the outer left breast, middle depth, felt to represent an [place] of normal fibroglandular tissue.  RECOMMENDATION: 1. Ultrasound-guided core needle biopsy of the mass at the 7 o'clock subareolar left breast. 2. Bilateral diagnostic mammography with left breast ultrasound in 6 months to confirm stability of the likely benign complex cyst at the 9 o'clock subareolar left breast.  I discussed with the patient the fact that if this is indeed an intraductal papilloma, it would require surgical excision as it is considered a high risk lesion. The ultrasound biopsy procedure was discussed in detail with the patient. All of her questions were answered. She has agreed to proceed and at her request, the biopsy has been scheduled for November 4.  I have discussed the findings and recommendations with the patient. Results were also provided in writing at the conclusion of the visit.  BI-RADS CATEGORY 4: Suspicious.   Electronically Signed By: Evangeline Dakin M.D. On: 08/21/2014 08:40    Pathology revealed fibrocystic changes in the left breast at 7:00. This was found to be discordant with imaging by Dr. Enrique Sack. Pathology was discussed with the patient by telephone. She reported doing well after the biopsy with tenderness at the site. Post biopsy instructions were reviewed and her questions were answered. She was encouraged to call [facility] for any additional concerns. Surgical  consultation has been arranged with Dr. Erroll Luna at [facility] on September 15, 2014. The patient is also interested in having a complex cyst removed in the left breast at 9:00. She is asked to return in 6 months for left breast ultrasound.  Pathology results reported by Susa Raring RN, BSN on September 03, 2014.   Electronically Signed By: Enrique Sack M.D. On: 09/03/2014 11:01   Breast, left, needle core biopsy, mass, 7 o'clock retroareolar - FIBROCYSTIC CHANGES. - THERE IS NO EVIDENCE OF MALIGNANCY. - SEE COMMENT.  The patient is a 38 year old female who presents with a breast mass. The patient is being seen for initial consultation for suspected neoplasm in the left breast that was first noted 12 month(s) ago. The patient was referred by a specialty consultant. Initial presentation was 12 month(s) ago with breast mass on self-examination. Evaluation to date has included mammography, BREAST ultrasound and core needle biopsy. Findings on mammography include an irregular mass. Classification based on mammographic findings is BiRads Class IV (suspicious). Ultrasound shows a complex mass. Cytology/histopathology is nondiagnostic. Other Problems Marjean Lisa Shepard, CMA; 09/15/2014 11:50 AM) No pertinent past medical history  Past Surgical History Marjean Lisa Shepard, [place]; 09/15/2014 11:50 AM) Breast Biopsy Left. Foot Surgery Left.  Diagnostic Studies History Marjean Lisa Shepard, [place]; 09/15/2014 11:50 AM) Colonoscopy never Mammogram within last year Pap Smear 1-5 years ago  Allergies Lisa Shepard, [place]; 09/15/2014 11:50 AM) No Known Drug Allergies 09/15/2014  Medication History (Sonya Shepard, CMA; 09/15/2014 11:50 AM) No Current Medications  Social History Marjean Lisa Shepard, [place]; 09/15/2014 11:50 AM) Alcohol use Occasional alcohol use. Caffeine use Carbonated beverages, Coffee. No drug use Tobacco use Never smoker.  Family History Marjean Lisa Shepard, [place];  09/15/2014 11:50 AM) Cerebrovascular Accident Father. Colon Cancer Father. Diabetes Mellitus Father. Hypertension Father, Mother. Thyroid problems Father.  Pregnancy / Birth History Marjean Lisa Shepard, [place]; 09/15/2014 11:50 AM) Age at menarche 81 years. Contraceptive History Contraceptive implant. Gravida 1 Maternal age 29-30 Para 1 Regular periods     Review of Systems ([place]; 09/15/2014 11:50 AM) General Not Present- Appetite Loss, Chills, Fatigue, Fever, Night Sweats, Weight Gain and Weight Loss. Skin Not Present- Change in Wart/Mole, Dryness, Hives, Jaundice, New Lesions, Non-Healing Wounds, Rash and Ulcer. HEENT Not Present- Earache, Hearing Loss, Hoarseness, Nose Bleed, Oral Ulcers, Ringing in the Ears, Seasonal Allergies, Sinus Pain, Sore Throat, Visual Disturbances, Wears glasses/contact lenses and Yellow Eyes. Respiratory Not Present- Bloody sputum, Chronic Cough, Difficulty Breathing, Snoring and Wheezing. Breast Present- Breast Mass. Not Present- Breast Pain, Nipple Discharge and Skin Changes. Cardiovascular Not Present- Chest Pain, Difficulty Breathing Lying Down, Leg Cramps, Palpitations, Rapid Heart Rate, Shortness of Breath and Swelling of Extremities. Gastrointestinal Not Present- Abdominal Pain, Bloating, Bloody Stool, Change in Bowel Habits, Chronic diarrhea, Constipation, Difficulty Swallowing, Excessive gas, Gets full quickly at meals, Hemorrhoids, Indigestion, Nausea, Rectal Pain and Vomiting. Female Genitourinary Not Present- Frequency, Nocturia, Painful Urination, Pelvic Pain and Urgency. Musculoskeletal Not Present- Back Pain, Joint Pain, Joint Stiffness, Muscle Pain, Muscle Weakness and Swelling of Extremities. Neurological Present- Headaches. Not Present- Decreased Memory, Fainting, Numbness, Seizures, Tingling, Tremor, Trouble walking and Weakness. Psychiatric Not Present- Anxiety, Bipolar, Change in Sleep Pattern, Depression, Fearful and  Frequent crying. Endocrine Not Present- Cold Intolerance, Excessive Hunger, Hair Changes, Heat Intolerance, Hot flashes and New Diabetes. Hematology Not Present- Easy Bruising, Excessive bleeding, Gland problems, HIV and Persistent Infections.  Vitals (Sonya Shepard CMA; 09/15/2014 11:52 AM) 09/15/2014 11:51 AM Weight: 181 lb Height: 59in Body Surface  Area: 1.85 m Body Mass Index: 36.56 kg/m Temp.: 44F(Temporal)  Pulse: 79 (Regular)  BP: 130/70 (Sitting, Left Arm, Standard)     Physical Exam (Vieno Tarrant A. Chiniqua Kilcrease MD; 09/15/2014 12:25 PM)  General Mental Status-Alert. General Appearance-Consistent with stated age. Hydration-Well hydrated. Voice-Normal.  Head and Neck Head-normocephalic, atraumatic with no lesions or palpable masses. Trachea-midline. Thyroid Gland Characteristics - normal size and consistency.  Eye Eyeball - Bilateral-Extraocular movements intact. Sclera/Conjunctiva - Bilateral-No scleral icterus.  Breast Breast Lump Left Central - Size - 3 cm (Height) and 2 mm (Width). Consistency - Firm. Mobility - Mobile.  Cardiovascular Cardiovascular examination reveals -normal heart sounds, regular rate and rhythm with no murmurs and normal pedal pulses bilaterally.  Neurologic Neurologic evaluation reveals -alert and oriented x 3 with no impairment of recent or remote memory. Mental Status-Normal.  Musculoskeletal Normal Exam - Left-Upper Extremity Strength Normal and Lower Extremity Strength Normal. Normal Exam - Right-Upper Extremity Strength Normal and Lower Extremity Strength Normal.  Lymphatic Head & Neck  General Head & Neck Lymphatics: Bilateral - Description - Normal. Axillary  General Axillary Region: Bilateral - Description - Normal. Tenderness - Non Tender. Femoral & Inguinal  Generalized Femoral & Inguinal Lymphatics: Bilateral - Description - Normal. Tenderness - Non Tender.    Assessment & Plan (Keary Hanak  A. Wilsie Kern MD; 09/15/2014 12:18 PM)  LEFT BREAST MASS (611.72  N63) Impression: pathology shows fibrocystic disease and this is discordant. Recommend leftbreast seed localized lumpectomy. Risk of lumpectomy include bleeding, infection, seroma, more surgery, use of seed/wire, wound care, cosmetic deformity and the need for other treatments, death , blood clots, death. Pt agrees to proceed.  MASS OF LEFT BREAST ON MAMMOGRAM (611.72  N63)  Current Plans Pt Education - CSS Breast Biopsy Instructions (FLB): discussed with patient and provided information.

## 2014-09-30 NOTE — Anesthesia Procedure Notes (Signed)
Procedure Name: LMA Insertion Date/Time: 09/30/2014 12:01 PM Performed by: Melynda Ripple D Pre-anesthesia Checklist: Patient identified, Emergency Drugs available, Suction available and Patient being monitored Patient Re-evaluated:Patient Re-evaluated prior to inductionOxygen Delivery Method: Circle System Utilized Preoxygenation: Pre-oxygenation with 100% oxygen Intubation Type: IV induction Ventilation: Mask ventilation without difficulty LMA: LMA inserted LMA Size: 4.0 Number of attempts: 1 Airway Equipment and Method: bite block Placement Confirmation: positive ETCO2 Tube secured with: Tape Dental Injury: Teeth and Oropharynx as per pre-operative assessment

## 2014-09-30 NOTE — Interval H&P Note (Signed)
History and Physical Interval Note:  09/30/2014 10:47 AM  Lisa Shepard  has presented today for surgery, with the diagnosis of Left Breast Mass  The various methods of treatment have been discussed with the patient and family. After consideration of risks, benefits and other options for treatment, the patient has consented to  Procedure(s): LEFT BREAST LUMPECTOMY WITH RADIOACTIVE SEED LOCALIZATION (Left) as a surgical intervention .  The patient's history has been reviewed, patient examined, no change in status, stable for surgery.  I have reviewed the patient's chart and labs.  Questions were answered to the patient's satisfaction.     Harnoor Kohles A.

## 2014-09-30 NOTE — Anesthesia Preprocedure Evaluation (Signed)
Anesthesia Evaluation  Patient identified by MRN, date of birth, ID band Patient awake    Reviewed: Allergy & Precautions, H&P , NPO status , Patient's Chart, lab work & pertinent test results  Airway Mallampati: II   Neck ROM: full    Dental   Pulmonary neg pulmonary ROS,          Cardiovascular negative cardio ROS      Neuro/Psych    GI/Hepatic   Endo/Other  Morbid obesity  Renal/GU      Musculoskeletal   Abdominal   Peds  Hematology   Anesthesia Other Findings   Reproductive/Obstetrics                             Anesthesia Physical Anesthesia Plan  ASA: I  Anesthesia Plan: General   Post-op Pain Management:    Induction: Intravenous  Airway Management Planned: LMA  Additional Equipment:   Intra-op Plan:   Post-operative Plan:   Informed Consent: I have reviewed the patients History and Physical, chart, labs and discussed the procedure including the risks, benefits and alternatives for the proposed anesthesia with the patient or authorized representative who has indicated his/her understanding and acceptance.     Plan Discussed with: CRNA, Anesthesiologist and Surgeon  Anesthesia Plan Comments:         Anesthesia Quick Evaluation

## 2014-10-02 ENCOUNTER — Telehealth (INDEPENDENT_AMBULATORY_CARE_PROVIDER_SITE_OTHER): Payer: Self-pay

## 2014-10-02 ENCOUNTER — Encounter (HOSPITAL_BASED_OUTPATIENT_CLINIC_OR_DEPARTMENT_OTHER): Payer: Self-pay | Admitting: Surgery

## 2014-10-02 NOTE — Telephone Encounter (Signed)
Called pt with path

## 2014-10-02 NOTE — Telephone Encounter (Signed)
-----   Message from Erroll Luna, MD sent at 10/02/2014 12:41 PM EST ----- benign

## 2015-03-08 ENCOUNTER — Other Ambulatory Visit: Payer: Self-pay | Admitting: Surgery

## 2015-03-08 DIAGNOSIS — N6320 Unspecified lump in the left breast, unspecified quadrant: Secondary | ICD-10-CM

## 2015-05-14 ENCOUNTER — Ambulatory Visit
Admission: RE | Admit: 2015-05-14 | Discharge: 2015-05-14 | Disposition: A | Discharge status: Home / Self Care | Payer: 59 | Source: Ambulatory Visit | Attending: Surgery | Admitting: Surgery

## 2015-05-14 ENCOUNTER — Other Ambulatory Visit: Payer: Self-pay | Admitting: Surgery

## 2015-05-14 DIAGNOSIS — N6320 Unspecified lump in the left breast, unspecified quadrant: Secondary | ICD-10-CM

## 2015-05-18 ENCOUNTER — Other Ambulatory Visit: Payer: Self-pay | Admitting: Surgery

## 2015-05-18 DIAGNOSIS — N6320 Unspecified lump in the left breast, unspecified quadrant: Secondary | ICD-10-CM

## 2015-06-04 ENCOUNTER — Other Ambulatory Visit: Payer: 59

## 2015-06-10 ENCOUNTER — Other Ambulatory Visit: Payer: 59

## 2015-07-09 ENCOUNTER — Ambulatory Visit: Payer: Self-pay | Admitting: Surgery

## 2016-11-07 ENCOUNTER — Other Ambulatory Visit: Payer: Self-pay | Admitting: Surgery

## 2016-11-07 ENCOUNTER — Other Ambulatory Visit: Payer: Self-pay | Admitting: Obstetrics & Gynecology

## 2016-11-07 DIAGNOSIS — Z1231 Encounter for screening mammogram for malignant neoplasm of breast: Secondary | ICD-10-CM

## 2017-01-11 ENCOUNTER — Ambulatory Visit
Admission: RE | Admit: 2017-01-11 | Discharge: 2017-01-11 | Disposition: A | Discharge status: Home / Self Care | Payer: 59 | Source: Ambulatory Visit | Attending: Obstetrics & Gynecology | Admitting: Obstetrics & Gynecology

## 2017-01-11 DIAGNOSIS — Z1231 Encounter for screening mammogram for malignant neoplasm of breast: Secondary | ICD-10-CM

## 2017-12-18 ENCOUNTER — Other Ambulatory Visit: Payer: Self-pay | Admitting: Obstetrics & Gynecology

## 2017-12-18 DIAGNOSIS — Z1231 Encounter for screening mammogram for malignant neoplasm of breast: Secondary | ICD-10-CM

## 2018-02-05 ENCOUNTER — Ambulatory Visit (HOSPITAL_COMMUNITY)
Admission: EM | Admit: 2018-02-05 | Discharge: 2018-02-05 | Disposition: A | Discharge status: Home / Self Care | Payer: 59 | Attending: Family Medicine | Admitting: Family Medicine

## 2018-02-05 ENCOUNTER — Encounter (HOSPITAL_COMMUNITY): Payer: Self-pay | Admitting: Emergency Medicine

## 2018-02-05 DIAGNOSIS — R05 Cough: Secondary | ICD-10-CM

## 2018-02-05 DIAGNOSIS — R059 Cough, unspecified: Secondary | ICD-10-CM

## 2018-02-05 MED ORDER — SODIUM CHLORIDE 0.9 % IJ SOLN
INTRAMUSCULAR | Status: AC
  Filled 2018-02-05: qty 10

## 2018-02-05 MED ORDER — BENZONATATE 100 MG PO CAPS: 100.0000 mg | ORAL_CAPSULE | Freq: Three times a day (TID) | ORAL | 0 refills | Status: DC

## 2018-02-05 NOTE — Discharge Instructions (Signed)
Be aware, your cough medication may cause drowsiness. Please do not drive, operate heavy machinery or make important decisions while on this medication, it can cloud your judgement.  You may try over the counter Delsym to use during the day.

## 2018-02-05 NOTE — ED Triage Notes (Signed)
Pt c/o cough x 1 week.

## 2018-02-06 NOTE — ED Provider Notes (Signed)
[place]   [phone] 02/05/18 Arrival Time: [place] PLAN:  1. Cough    Meds ordered this encounter  Medications  . benzonatate (TESSALON) 100 MG capsule    Sig: Take 1 capsule (100 mg total) by mouth every 8 (eight) hours.    Dispense:  21 capsule    Refill:  0   Discussed typical duration of post-viral cough. OTC symptom care as needed. Ensure adequate fluid intake and rest. May f/u with PCP or here as needed.  Reviewed expectations re: course of current medical issues. Questions answered. Outlined signs and symptoms indicating need for more acute intervention. Patient verbalized understanding. After Visit Summary given.   SUBJECTIVE: History from: patient.  Javaria Knapke is a 42 y.o. female who presents with complaint of nasal congestion, post-nasal drainage, and a persistent dry cough. Mostly symptoms have resolved except for her dry, "annoying" cough. Onset of cold symptoms was abrupt, approximately 1 week ago. SOB: none. Wheezing: none. Fever: no. Overall normal PO intake without n/v. Sick contacts: no. OTC treatment: cough medication without relief.  Social History   Tobacco Use  Smoking Status Never Smoker    ROS: As per HPI.   OBJECTIVE:  Vitals:   02/05/18 1544  BP: 124/84  Pulse: 87  Resp: 16  Temp: 98.3 F (36.8 C)  SpO2: 100%     General appearance: alert; appears fatigued HEENT: mild nasal congestion; throat irritation secondary to post-nasal drainage Neck: supple without LAD Lungs: unlabored respirations, symmetrical air entry; cough: mild; no respiratory distress Skin: warm and dry Psychological: alert and cooperative; normal mood and affect  Imaging: No results found.  Allergies  Allergen Reactions  . Codeine Nausea And Vomiting    Past Medical History:  Diagnosis Date  . Anemia   . Wears contact lenses    Family History  Problem Relation Age of Onset  . Breast cancer Paternal Aunt    Social  History   Socioeconomic History  . Marital status: Single    Spouse name: Not on file  . Number of children: Not on file  . Years of education: Not on file  . Highest education level: Not on file  Occupational History  . Not on file  Social Needs  . Financial resource strain: Not on file  . Food insecurity:    Worry: Not on file    Inability: Not on file  . Transportation needs:    Medical: Not on file    Non-medical: Not on file  Tobacco Use  . Smoking status: Never Smoker  Substance and Sexual Activity  . Alcohol use: Yes    Comment: occ  . Drug use: No  . Sexual activity: Not on file  Lifestyle  . Physical activity:    Days per week: Not on file    Minutes per session: Not on file  . Stress: Not on file  Relationships  . Social connections:    Talks on phone: Not on file    Gets together: Not on file    Attends religious service: Not on file    Active member of club or organization: Not on file    Attends meetings of clubs or organizations: Not on file    Relationship status: Not on file  . Intimate partner violence:    Fear of current or ex partner: Not on file    Emotionally abused: Not on file    Physically abused: Not on file    Forced sexual activity:  Not on file  Other Topics Concern  . Not on file  Social History Narrative  . Not on file           Vanessa Kick, MD 02/06/18 [phone]

## 2018-02-13 ENCOUNTER — Ambulatory Visit
Admission: RE | Admit: 2018-02-13 | Discharge: 2018-02-13 | Disposition: A | Discharge status: Home / Self Care | Payer: 59 | Source: Ambulatory Visit | Attending: Obstetrics & Gynecology | Admitting: Obstetrics & Gynecology

## 2018-02-13 DIAGNOSIS — Z1231 Encounter for screening mammogram for malignant neoplasm of breast: Secondary | ICD-10-CM

## 2019-05-07 DIAGNOSIS — Z131 Encounter for screening for diabetes mellitus: Secondary | ICD-10-CM | POA: Diagnosis not present

## 2019-05-07 DIAGNOSIS — L918 Other hypertrophic disorders of the skin: Secondary | ICD-10-CM | POA: Diagnosis not present

## 2019-05-07 DIAGNOSIS — Z0000 Encounter for general adult medical examination without abnormal findings: Secondary | ICD-10-CM | POA: Diagnosis not present

## 2019-05-07 DIAGNOSIS — Z8639 Personal history of other endocrine, nutritional and metabolic disease: Secondary | ICD-10-CM | POA: Diagnosis not present

## 2019-06-11 DIAGNOSIS — D485 Neoplasm of uncertain behavior of skin: Secondary | ICD-10-CM | POA: Diagnosis not present

## 2019-07-10 ENCOUNTER — Other Ambulatory Visit: Payer: Self-pay | Admitting: Family Medicine

## 2019-07-10 DIAGNOSIS — Z1231 Encounter for screening mammogram for malignant neoplasm of breast: Secondary | ICD-10-CM

## 2019-08-27 ENCOUNTER — Ambulatory Visit
Admission: RE | Admit: 2019-08-27 | Discharge: 2019-08-27 | Disposition: A | Discharge status: Home / Self Care | Payer: 59 | Source: Ambulatory Visit | Attending: Family Medicine | Admitting: Family Medicine

## 2019-08-27 ENCOUNTER — Other Ambulatory Visit: Payer: Self-pay

## 2019-08-27 DIAGNOSIS — Z1231 Encounter for screening mammogram for malignant neoplasm of breast: Secondary | ICD-10-CM

## 2019-10-01 DIAGNOSIS — D485 Neoplasm of uncertain behavior of skin: Secondary | ICD-10-CM | POA: Diagnosis not present

## 2020-01-21 DIAGNOSIS — Z01419 Encounter for gynecological examination (general) (routine) without abnormal findings: Secondary | ICD-10-CM | POA: Diagnosis not present

## 2020-01-21 DIAGNOSIS — Z124 Encounter for screening for malignant neoplasm of cervix: Secondary | ICD-10-CM | POA: Diagnosis not present

## 2020-03-16 DIAGNOSIS — R194 Change in bowel habit: Secondary | ICD-10-CM | POA: Diagnosis not present

## 2020-03-16 DIAGNOSIS — Z800 Family history of malignant neoplasm of digestive organs: Secondary | ICD-10-CM | POA: Diagnosis not present

## 2020-04-07 DIAGNOSIS — I808 Phlebitis and thrombophlebitis of other sites: Secondary | ICD-10-CM | POA: Diagnosis not present

## 2020-04-19 ENCOUNTER — Other Ambulatory Visit: Payer: Self-pay | Admitting: Physician Assistant

## 2020-04-19 DIAGNOSIS — I808 Phlebitis and thrombophlebitis of other sites: Secondary | ICD-10-CM | POA: Diagnosis not present

## 2020-04-23 DIAGNOSIS — Z03818 Encounter for observation for suspected exposure to other biological agents ruled out: Secondary | ICD-10-CM | POA: Diagnosis not present

## 2020-04-28 DIAGNOSIS — Z1211 Encounter for screening for malignant neoplasm of colon: Secondary | ICD-10-CM | POA: Diagnosis not present

## 2020-04-28 DIAGNOSIS — Z800 Family history of malignant neoplasm of digestive organs: Secondary | ICD-10-CM | POA: Diagnosis not present

## 2020-04-28 DIAGNOSIS — K640 First degree hemorrhoids: Secondary | ICD-10-CM | POA: Diagnosis not present

## 2020-05-12 DIAGNOSIS — Z13220 Encounter for screening for lipoid disorders: Secondary | ICD-10-CM | POA: Diagnosis not present

## 2020-05-12 DIAGNOSIS — Z0000 Encounter for general adult medical examination without abnormal findings: Secondary | ICD-10-CM | POA: Diagnosis not present

## 2020-05-12 DIAGNOSIS — Z1389 Encounter for screening for other disorder: Secondary | ICD-10-CM | POA: Diagnosis not present

## 2020-05-12 DIAGNOSIS — R0989 Other specified symptoms and signs involving the circulatory and respiratory systems: Secondary | ICD-10-CM | POA: Diagnosis not present

## 2020-05-12 DIAGNOSIS — Z1329 Encounter for screening for other suspected endocrine disorder: Secondary | ICD-10-CM | POA: Diagnosis not present

## 2020-05-12 DIAGNOSIS — M79602 Pain in left arm: Secondary | ICD-10-CM | POA: Diagnosis not present

## 2020-07-26 ENCOUNTER — Other Ambulatory Visit: Payer: Self-pay | Admitting: Family Medicine

## 2020-07-26 DIAGNOSIS — Z1231 Encounter for screening mammogram for malignant neoplasm of breast: Secondary | ICD-10-CM

## 2020-09-01 ENCOUNTER — Ambulatory Visit
Admission: RE | Admit: 2020-09-01 | Discharge: 2020-09-01 | Disposition: A | Discharge status: Home / Self Care | Payer: No Typology Code available for payment source | Source: Ambulatory Visit | Attending: Family Medicine | Admitting: Family Medicine

## 2020-09-01 ENCOUNTER — Other Ambulatory Visit: Payer: Self-pay

## 2020-09-01 DIAGNOSIS — Z1231 Encounter for screening mammogram for malignant neoplasm of breast: Secondary | ICD-10-CM | POA: Diagnosis not present

## 2021-01-26 DIAGNOSIS — N760 Acute vaginitis: Secondary | ICD-10-CM | POA: Diagnosis not present

## 2021-01-26 DIAGNOSIS — R69 Illness, unspecified: Secondary | ICD-10-CM | POA: Diagnosis not present

## 2021-01-26 DIAGNOSIS — Z113 Encounter for screening for infections with a predominantly sexual mode of transmission: Secondary | ICD-10-CM | POA: Diagnosis not present

## 2021-01-26 DIAGNOSIS — Z124 Encounter for screening for malignant neoplasm of cervix: Secondary | ICD-10-CM | POA: Diagnosis not present

## 2021-01-26 DIAGNOSIS — Z01419 Encounter for gynecological examination (general) (routine) without abnormal findings: Secondary | ICD-10-CM | POA: Diagnosis not present

## 2021-02-18 DIAGNOSIS — N920 Excessive and frequent menstruation with regular cycle: Secondary | ICD-10-CM | POA: Diagnosis not present

## 2021-02-18 DIAGNOSIS — D259 Leiomyoma of uterus, unspecified: Secondary | ICD-10-CM | POA: Diagnosis not present

## 2021-08-10 DIAGNOSIS — N920 Excessive and frequent menstruation with regular cycle: Secondary | ICD-10-CM | POA: Diagnosis not present

## 2021-08-17 ENCOUNTER — Other Ambulatory Visit: Payer: Self-pay

## 2021-08-17 ENCOUNTER — Encounter (HOSPITAL_BASED_OUTPATIENT_CLINIC_OR_DEPARTMENT_OTHER): Payer: Self-pay | Admitting: Obstetrics and Gynecology

## 2021-08-17 DIAGNOSIS — Z20822 Contact with and (suspected) exposure to covid-19: Secondary | ICD-10-CM | POA: Diagnosis not present

## 2021-08-17 DIAGNOSIS — Z01812 Encounter for preprocedural laboratory examination: Secondary | ICD-10-CM | POA: Diagnosis not present

## 2021-08-17 DIAGNOSIS — S025XXA Fracture of tooth (traumatic), initial encounter for closed fracture: Secondary | ICD-10-CM

## 2021-08-17 HISTORY — DX: Fracture of tooth (traumatic), initial encounter for closed fracture: S02.5XXA

## 2021-08-17 NOTE — Progress Notes (Addendum)
Spoke w/ via phone for pre-op interview---pt Lab needs dos----       urine preg        Lab results------lab appt 08-22-2021 for cbc with dif t & s COVID test -----08-22-2021 extended recovery Arrive 530 am 08-24-2021 NPO after MN NO Solid Food.  Clear liquids from MN until---430 am Med rec completed Medications to take morning of surgery -----none Diabetic medication -----n/a Patient instructed no nail polish to be worn day of surgery Patient instructed to bring photo id and insurance card day of surgery Patient aware to have Driver (ride ) / caregiver    for 24 hours after surgery:  spouse Scientist, forensic  Patient Special Instructions -----pt given extended stay instructions Pre-Op special Istructions -----none Patient verbalized understanding of instructions that were given at this phone interview. Patient denies shortness of breath, chest pain, fever, cough at this phone interview.   Addendum: left voice mail message for schedul nurse at dr Vilinda Boehringer office, please let dr Vilinda Boehringer know hemaglobin at labs today was 9.2

## 2021-08-17 NOTE — Progress Notes (Signed)
YOU ARE SCHEDULED FOR A COVID TEST ON   08-22-2021  . THIS TEST MUST BE DONE BEFORE SURGERY. GO TO  [facility] @ [place]   PHONE NUMBER [phone].   TURN LEFT AT THE SHIPPING AND RECEIVING SIGN AND LOOK FOR SMALL BLUE POP UP TENT UNDER BUILDING OVERHANG AT BACK OF BUILDING AND REMAIN IN YOUR CAR, THIS IS A DRIVE UP TEST.  AFTER YOUR COVID TEST , PLEASE WEAR A MASK OUT IN PUBLIC AND SOCIAL DISTANCE AND [place] YOUR HANDS FREQUENTLY. PLEASE ASK ALL YOUR CLOSE HOUSEHOLD CONTACT TO WEAR MASK OUT IN PUBLIC AND SOCIAL DISTANCE AND [place] HANDS FREQUENTLY ALSO.      Your procedure is scheduled on 08-24-2021  Report to [facility].   Call this number if you have problems the morning of surgery  :[phone].   OUR ADDRESS IS [address] [place].  WE ARE LOCATED IN [facility].  PLEASE BRING YOUR INSURANCE CARD AND PHOTO ID DAY OF SURGERY.  ONLY ONE PERSON ALLOWED IN FACILITY WAITING AREA.                                     REMEMBER:  DO NOT EAT FOOD, CANDY GUM OR MINTS  AFTER MIDNIGHT . YOU MAY HAVE CLEAR LIQUIDS FROM MIDNIGHT UNTIL 430 AM. NO CLEAR LIQUIDS AFTER  430 AM DAY OF SURGERY.   YOU MAY  BRUSH YOUR TEETH MORNING OF SURGERY AND RINSE YOUR MOUTH OUT, NO CHEWING GUM CANDY OR MINTS.    CLEAR LIQUID DIET   Foods Allowed                                                                     Foods Excluded  Coffee and tea, regular and decaf                             liquids that you cannot  Plain Jell-O any favor except red or purple                                           see through such as: Fruit ices (not with fruit pulp)                                     milk, soups, orange juice  Iced Popsicles                                    All solid food Carbonated beverages, regular and diet                                    Cranberry, grape and apple juices Sports drinks like Gatorade Lightly  seasoned clear  broth or consume(fat free) Sugar  Sample Menu Breakfast                                Lunch                                     Supper Cranberry juice                    Beef broth                            Chicken broth Jell-O                                     Grape juice                           Apple juice Coffee or tea                        Jell-O                                      Popsicle                                                Coffee or tea                        Coffee or tea  _____________________________________________________________________     TAKE THESE MEDICATIONS MORNING OF SURGERY WITH A SIP OF WATER:  NONE  ONE VISITOR IS ALLOWED IN WAITING ROOM ONLY DAY OF SURGERY.  YOU MAY HAVE ANOTHER PERSON SWITCH OUT WITH THE  1  VISITOR IN THE WAITING ROOM DAY OF SURGERY AND A MASK MUST BE WORN IN THE WAITING ROOM.    2 VISITORS  MAY VISIT IN THE EXTENDED RECOVERY ROOM UNTIL 800 PM ONLY 1 VISITOR AGE 47 AND OVER MAY SPEND THE NIGHT AND MUST BE IN EXTENDED RECOVERY ROOM NO LATER THAN 800 PM .   UP TO 2 CHILDREN AGE 58 TO 15 MAY ALSO VISIT IN EXTENDED RECOVERY ROOM ONLY UNTIL 800 PM AND MUST LEAVE BY 800 PM. ALL PERSONS VISITING IN EXTENDED RECOVERY ROOM MUST WEAR A MASK.                                    DO NOT WEAR JEWERLY, MAKE UP. DO NOT WEAR LOTIONS, POWDERS, PERFUMES OR NAIL POLISH. DO NOT SHAVE FOR 48 HOURS PRIOR TO DAY OF SURGERY. MEN MAY SHAVE FACE AND NECK. CONTACTS, GLASSES, OR DENTURES MAY NOT BE WORN TO SURGERY.                                    [facility] IS NOT RESPONSIBLE  FOR ANY BELONGINGS.                                                                    [facility]  [facility] - Preparing for Surgery Before surgery, you can play an important role.  Because skin is not sterile, your skin needs to be as free of germs as possible.  You can reduce the number of germs on your skin by washing with CHG (chlorahexidine gluconate) soap  before surgery.  CHG is an antiseptic cleaner which kills germs and bonds with the skin to continue killing germs even after washing. Please DO NOT use if you have an allergy to CHG or antibacterial soaps.  If your skin becomes reddened/irritated stop using the CHG and inform your nurse when you arrive at Short Stay. Do not shave (including legs and underarms) for at least 48 hours prior to the first CHG shower.  You may shave your face/neck. Please follow these instructions carefully:  1.  Shower with CHG Soap the night before surgery and the  morning of Surgery.  2.  If you choose to wash your hair, wash your hair first as usual with your  normal  shampoo.  3.  After you shampoo, rinse your hair and body thoroughly to remove the  shampoo.                            4.  Use CHG as you would any other liquid soap.  You can apply chg directly  to the skin and wash                      Gently with a scrungie or clean washcloth.  5.  Apply the CHG Soap to your body ONLY FROM THE NECK DOWN.   Do not use on face/ open                           Wound or open sores. Avoid contact with eyes, ears mouth and genitals (private parts).                       Wash face,  Genitals (private parts) with your normal soap.             6.  Wash thoroughly, paying special attention to the area where your surgery  will be performed.  7.  Thoroughly rinse your body with warm water from the neck down.  8.  DO NOT shower/wash with your normal soap after using and rinsing off  the CHG Soap.                9.  Pat yourself dry with a clean towel.            10.  Wear clean pajamas.            11.  Place clean sheets on your bed the night of your first shower and do not  sleep with pets. Day of Surgery : Do not apply any lotions/deodorants the morning of surgery.  Please wear clean clothes to the hospital/surgery center.  FAILURE TO FOLLOW THESE INSTRUCTIONS MAY RESULT IN THE CANCELLATION OF YOUR SURGERY PATIENT  SIGNATURE_________________________________  NURSE SIGNATURE__________________________________  ________________________________________________________________________                                                        QUESTIONS Lisa Shepard  Lisa Shepard PRE OP NURSE PHONE [phone].

## 2021-08-22 ENCOUNTER — Other Ambulatory Visit: Payer: Self-pay | Admitting: Obstetrics and Gynecology

## 2021-08-22 ENCOUNTER — Other Ambulatory Visit: Payer: Self-pay

## 2021-08-22 ENCOUNTER — Encounter (HOSPITAL_COMMUNITY)
Admission: RE | Admit: 2021-08-22 | Discharge: 2021-08-22 | Disposition: A | Discharge status: Home / Self Care | Payer: 59 | Source: Ambulatory Visit | Attending: Obstetrics and Gynecology | Admitting: Obstetrics and Gynecology

## 2021-08-22 DIAGNOSIS — Z01812 Encounter for preprocedural laboratory examination: Secondary | ICD-10-CM | POA: Insufficient documentation

## 2021-08-22 DIAGNOSIS — Z20822 Contact with and (suspected) exposure to covid-19: Secondary | ICD-10-CM | POA: Diagnosis not present

## 2021-08-22 LAB — CBC WITH DIFFERENTIAL/PLATELET
Abs Immature Granulocytes: 0.02 10*3/uL (ref 0.00–0.07)
Basophils Absolute: 0.1 10*3/uL (ref 0.0–0.1)
Basophils Relative: 1 %
Eosinophils Absolute: 0.1 10*3/uL (ref 0.0–0.5)
Eosinophils Relative: 1 %
HCT: 30.9 % — ABNORMAL LOW (ref 36.0–46.0)
Hemoglobin: 9.2 g/dL — ABNORMAL LOW (ref 12.0–15.0)
Immature Granulocytes: 0 %
Lymphocytes Relative: 20 %
Lymphs Abs: 1.3 10*3/uL (ref 0.7–4.0)
MCH: 22.8 pg — ABNORMAL LOW (ref 26.0–34.0)
MCHC: 29.8 g/dL — ABNORMAL LOW (ref 30.0–36.0)
MCV: 76.5 fL — ABNORMAL LOW (ref 80.0–100.0)
Monocytes Absolute: 0.4 10*3/uL (ref 0.1–1.0)
Monocytes Relative: 6 %
Neutro Abs: 4.9 10*3/uL (ref 1.7–7.7)
Neutrophils Relative %: 72 %
Platelets: 508 10*3/uL — ABNORMAL HIGH (ref 150–400)
RBC: 4.04 MIL/uL (ref 3.87–5.11)
RDW: 18.6 % — ABNORMAL HIGH (ref 11.5–15.5)
WBC: 6.7 10*3/uL (ref 4.0–10.5)
nRBC: 0 % (ref 0.0–0.2)

## 2021-08-22 LAB — SARS CORONAVIRUS 2 (TAT 6-24 HRS): SARS Coronavirus 2: NEGATIVE

## 2021-08-23 NOTE — H&P (Signed)
Lisa Shepard is an 45 y.o. female P1 with menorrhagia and fibroids, here for [place] and to discuss hysterectomy.  Reports heavy periods x 2 years.  Regular monthly periods, lasts 5 days, heaviest first 3 days has to change pad/tampon every hour.  H/o anemia. Has paragard IUD in place [place] grandmother had ovarian cancer  02/18/21 [place]: multifibroid uterus measuring 7.79 x 8.05cm with 5.52mm EMS, paragard in place. 4 largest fibroid 1.5-3.1cm, intraumural and submucosal. Right ovary 4x2.7x2, left ovary 3.2x1.5x2.2.   08/10/21 Hgb 10.5  Menstrual History: Patient's last menstrual period was 08/11/2021.    Past Medical History:  Diagnosis Date   Anemia    Chipped tooth 08/17/2021   1st left upper tooth chipped not loose per pt   Uterine fibroid    Wears contact lenses    Wears glasses     Past Surgical History:  Procedure Laterality Date   BREAST BIOPSY Left    BREAST EXCISIONAL BIOPSY Left 2015   BENIGN BREAST TISSUE WITH RADIAL SCAR   BREAST LUMPECTOMY WITH RADIOACTIVE SEED LOCALIZATION Left 09/30/2014   Procedure: LEFT BREAST LUMPECTOMY WITH RADIOACTIVE SEED LOCALIZATION;  Surgeon: Erroll Luna, MD;  Location: [facility];  Service: General;  Laterality: Left;   MOUTH SURGERY     teeth extraction   ORIF ANKLE FRACTURE Left 2010   steel plate in place    Family History  Problem Relation Age of Onset   Breast cancer Paternal Aunt     Social History:  reports that she has never smoked. She has never used smokeless tobacco. She reports current alcohol use. She reports that she does not use drugs.  Allergies:  Allergies  Allergen Reactions   Codeine Nausea And Vomiting   Other     Fuzzy fruits strawberry, peaches, kiwi cause rash on arms    Medications Prior to Admission  Medication Sig Dispense Refill Last Dose   acetaminophen (TYLENOL) 500 MG tablet Take 500 mg by mouth every 6 (six) hours as needed.   Past Week   Ascorbic Acid (VITAMIN C PO) Take by  mouth daily.   Past Week   ferrous sulfate 325 (65 FE) MG tablet Take 325 mg by mouth daily with breakfast.   Past Week   Multiple Vitamin (MULTIVITAMIN) capsule Take 1 capsule by mouth daily.   Past Week   paragard intrauterine copper IUD IUD 1 each by Intrauterine route once. Inserted 2012      Probiotic Product (PROBIOTIC PO) Take by mouth daily.   Past Week   VITAMIN E PO Take by mouth daily.   Past Week    Review of Systems  Constitutional:  Negative for fever.  HENT:  Negative for sore throat.   Eyes:  Negative for pain.  Respiratory:  Negative for shortness of breath.   Cardiovascular:  Negative for chest pain.  Gastrointestinal:  Negative for abdominal pain.  Genitourinary:  Positive for menstrual problem and vaginal bleeding.  Musculoskeletal:  Negative for arthralgias.  Skin:  Negative for rash.  Neurological:  Negative for headaches.  Psychiatric/Behavioral:  Negative for suicidal ideas.    Blood pressure (!) 135/94, pulse (!) 104, temperature 98.8 F (37.1 C), temperature source Oral, resp. rate 16, height 4\' 11"  (1.499 m), weight 83.5 kg, last menstrual period 08/11/2021, SpO2 100 %. Physical Exam  From 10/12 office visit Constitutional General Appearance: obese  Head Head: normocephalic  Lungs Respiratory Effort: no accessory muscle usage, no intercostal retractions  Abdomen Inspection/Palpation/Auscultation: (soft, nontender)  Extremities  Legs: normal Arms: normal Skin Appearance: no obvious rashes, no obvious lesions  Neurological System Impressions motor: no deficits, sensory: no deficits  Psychiatric Orientation: to person, to time Mood and Affect: active and alert, normal mood, normal affect Heart: RRR Lungs: CTAB Results for orders placed or performed during the hospital encounter of 08/24/21 (from the past 24 hour(s))  Pregnancy, urine POC     Status: None   Collection Time: 08/24/21  5:37 AM  Result Value Ref Range   Preg Test, Ur NEGATIVE  NEGATIVE  ABO/Rh     Status: None   Collection Time: 08/24/21  6:32 AM  Result Value Ref Range   ABO/RH(D)      O POS Performed at [facility] [address] [place], [place] [address]     No results found.  Assessment/Plan: 45Y P1 with h/o multifibroid uterus and menorrhagia - RA-TLH, bilateral salpingectomy, possible bilateral or unilateral oopherectomy, cystoscopy - Risks/benefits/alternatives reviewed. Risks including infection, bleeding, damage to surrounding organs, laparotomy. Plan for bilateral or unilateral oophorectomy only if grossly abnormal. - Preop hgb 9.2 (was 10.5 two weeks ago prior to her most recent period), she is consented for blood transfusion if needed.   Rowland Lathe 08/24/2021, 7:21 AM

## 2021-08-23 NOTE — Anesthesia Preprocedure Evaluation (Addendum)
Anesthesia Evaluation  Patient identified by MRN, date of birth, ID band Patient awake    Reviewed: Allergy & Precautions, NPO status , Patient's Chart, lab work & pertinent test results  Airway Mallampati: II  TM Distance: >3 FB Neck ROM: Full    Dental  (+) Teeth Intact   Pulmonary neg pulmonary ROS,    Pulmonary exam normal        Cardiovascular negative cardio ROS   Rhythm:Regular Rate:Normal     Neuro/Psych negative neurological ROS  negative psych ROS   GI/Hepatic negative GI ROS, Neg liver ROS,   Endo/Other  negative endocrine ROS  Renal/GU negative Renal ROS  Female GU complaint fibroids    Musculoskeletal negative musculoskeletal ROS (+)   Abdominal (+)  Abdomen: soft.    Peds  Hematology  (+) anemia ,   Anesthesia Other Findings   Reproductive/Obstetrics                            Anesthesia Physical Anesthesia Plan  ASA: 2  Anesthesia Plan: General   Post-op Pain Management:    Induction: Intravenous  PONV Risk Score and Plan: 3 and Ondansetron, Dexamethasone, Midazolam and Treatment may vary due to age or medical condition  Airway Management Planned: Mask and Oral ETT  Additional Equipment: None  Intra-op Plan:   Post-operative Plan: Extubation in OR  Informed Consent: I have reviewed the patients History and Physical, chart, labs and discussed the procedure including the risks, benefits and alternatives for the proposed anesthesia with the patient or authorized representative who has indicated his/her understanding and acceptance.     Dental advisory given  Plan Discussed with: CRNA  Anesthesia Plan Comments: (Lab Results      Component                Value               Date                      WBC                      6.7                 08/22/2021                HGB                      9.2 (L)             08/22/2021                HCT                       30.9 (L)            08/22/2021                MCV                      76.5 (L)            08/22/2021                PLT                      508 (H)  08/22/2021           Lab Results      Component                Value               Date                      NA                       140                 05/27/2009                K                        3.7                 05/27/2009                CO2                      27                  05/27/2009                GLUCOSE                  107 (H)             05/27/2009                BUN                      10                  05/27/2009                CREATININE               0.86                05/27/2009                CALCIUM                  9.0                 05/27/2009                GFRNONAA                 >60                 05/27/2009          )       Anesthesia Quick Evaluation

## 2021-08-24 ENCOUNTER — Ambulatory Visit (HOSPITAL_BASED_OUTPATIENT_CLINIC_OR_DEPARTMENT_OTHER)
Admission: RE | Admit: 2021-08-24 | Discharge: 2021-08-25 | Disposition: A | Discharge status: Home / Self Care | Payer: 59 | Source: Ambulatory Visit | Attending: Obstetrics and Gynecology | Admitting: Obstetrics and Gynecology

## 2021-08-24 ENCOUNTER — Encounter (HOSPITAL_BASED_OUTPATIENT_CLINIC_OR_DEPARTMENT_OTHER)
Admission: RE | Disposition: A | Discharge status: Home / Self Care | Payer: Self-pay | Source: Ambulatory Visit | Attending: Obstetrics and Gynecology

## 2021-08-24 ENCOUNTER — Other Ambulatory Visit: Payer: Self-pay

## 2021-08-24 ENCOUNTER — Encounter (HOSPITAL_BASED_OUTPATIENT_CLINIC_OR_DEPARTMENT_OTHER): Payer: Self-pay | Admitting: Obstetrics and Gynecology

## 2021-08-24 ENCOUNTER — Ambulatory Visit (HOSPITAL_BASED_OUTPATIENT_CLINIC_OR_DEPARTMENT_OTHER): Payer: 59 | Admitting: Anesthesiology

## 2021-08-24 DIAGNOSIS — D259 Leiomyoma of uterus, unspecified: Secondary | ICD-10-CM | POA: Insufficient documentation

## 2021-08-24 DIAGNOSIS — Z885 Allergy status to narcotic agent status: Secondary | ICD-10-CM | POA: Insufficient documentation

## 2021-08-24 DIAGNOSIS — N920 Excessive and frequent menstruation with regular cycle: Secondary | ICD-10-CM | POA: Insufficient documentation

## 2021-08-24 DIAGNOSIS — N888 Other specified noninflammatory disorders of cervix uteri: Secondary | ICD-10-CM | POA: Insufficient documentation

## 2021-08-24 DIAGNOSIS — N879 Dysplasia of cervix uteri, unspecified: Secondary | ICD-10-CM | POA: Diagnosis not present

## 2021-08-24 DIAGNOSIS — D630 Anemia in neoplastic disease: Secondary | ICD-10-CM | POA: Diagnosis not present

## 2021-08-24 DIAGNOSIS — D252 Subserosal leiomyoma of uterus: Secondary | ICD-10-CM | POA: Diagnosis not present

## 2021-08-24 DIAGNOSIS — Z90710 Acquired absence of both cervix and uterus: Secondary | ICD-10-CM

## 2021-08-24 DIAGNOSIS — D251 Intramural leiomyoma of uterus: Secondary | ICD-10-CM | POA: Diagnosis not present

## 2021-08-24 DIAGNOSIS — N838 Other noninflammatory disorders of ovary, fallopian tube and broad ligament: Secondary | ICD-10-CM | POA: Diagnosis not present

## 2021-08-24 HISTORY — PX: CYSTOSCOPY: SHX5120

## 2021-08-24 HISTORY — PX: ROBOTIC ASSISTED TOTAL HYSTERECTOMY WITH BILATERAL SALPINGO OOPHERECTOMY: SHX6086

## 2021-08-24 HISTORY — DX: Leiomyoma of uterus, unspecified: D25.9

## 2021-08-24 HISTORY — DX: Presence of spectacles and contact lenses: Z97.3

## 2021-08-24 LAB — TYPE AND SCREEN
ABO/RH(D): O POS
Antibody Screen: NEGATIVE

## 2021-08-24 LAB — POCT PREGNANCY, URINE: Preg Test, Ur: NEGATIVE

## 2021-08-24 LAB — ABO/RH: ABO/RH(D): O POS

## 2021-08-24 SURGERY — HYSTERECTOMY, TOTAL, ROBOT-ASSISTED, LAPAROSCOPIC, WITH BILATERAL SALPINGO-OOPHORECTOMY
Anesthesia: General | Site: Bladder

## 2021-08-24 MED ORDER — LACTATED RINGERS IV SOLN: INTRAVENOUS | Status: DC

## 2021-08-24 MED ORDER — KETOROLAC TROMETHAMINE 30 MG/ML IJ SOLN
INTRAMUSCULAR | Status: DC | PRN
  Administered 2021-08-24: 30 mg via INTRAVENOUS

## 2021-08-24 MED ORDER — KETOROLAC TROMETHAMINE 30 MG/ML IJ SOLN
30.0000 mg | Freq: Four times a day (QID) | INTRAMUSCULAR | Status: DC
  Administered 2021-08-24 – 2021-08-25 (×3): 30 mg via INTRAVENOUS

## 2021-08-24 MED ORDER — PHENYLEPHRINE 40 MCG/ML (10ML) SYRINGE FOR IV PUSH (FOR BLOOD PRESSURE SUPPORT)
PREFILLED_SYRINGE | INTRAVENOUS | Status: AC
  Filled 2021-08-24: qty 10

## 2021-08-24 MED ORDER — KETOROLAC TROMETHAMINE 30 MG/ML IJ SOLN
INTRAMUSCULAR | Status: AC
  Filled 2021-08-24: qty 1

## 2021-08-24 MED ORDER — SODIUM CHLORIDE 0.9 % IV SOLN
INTRAVENOUS | Status: DC | PRN
  Administered 2021-08-24: 60 mL

## 2021-08-24 MED ORDER — SIMETHICONE 80 MG PO CHEW: 80.0000 mg | CHEWABLE_TABLET | Freq: Four times a day (QID) | ORAL | Status: DC | PRN

## 2021-08-24 MED ORDER — SUGAMMADEX SODIUM 200 MG/2ML IV SOLN
INTRAVENOUS | Status: DC | PRN
  Administered 2021-08-24: 200 mg via INTRAVENOUS

## 2021-08-24 MED ORDER — CEFAZOLIN SODIUM-DEXTROSE 2-4 GM/100ML-% IV SOLN
INTRAVENOUS | Status: AC
  Filled 2021-08-24: qty 100

## 2021-08-24 MED ORDER — HYDROMORPHONE HCL 1 MG/ML IJ SOLN: 0.2000 mg | INTRAMUSCULAR | Status: DC | PRN

## 2021-08-24 MED ORDER — ONDANSETRON HCL 4 MG/2ML IJ SOLN
4.0000 mg | Freq: Four times a day (QID) | INTRAMUSCULAR | Status: DC | PRN
  Administered 2021-08-24: 4 mg via INTRAVENOUS

## 2021-08-24 MED ORDER — ACETAMINOPHEN 500 MG PO TABS
1000.0000 mg | ORAL_TABLET | ORAL | Status: AC
  Administered 2021-08-24: 1000 mg via ORAL

## 2021-08-24 MED ORDER — FENTANYL CITRATE (PF) 100 MCG/2ML IJ SOLN
INTRAMUSCULAR | Status: AC
  Filled 2021-08-24: qty 2

## 2021-08-24 MED ORDER — CEFAZOLIN SODIUM-DEXTROSE 2-4 GM/100ML-% IV SOLN
2.0000 g | INTRAVENOUS | Status: AC
  Administered 2021-08-24: 2 g via INTRAVENOUS

## 2021-08-24 MED ORDER — LIDOCAINE 2% (20 MG/ML) 5 ML SYRINGE
INTRAMUSCULAR | Status: AC
  Filled 2021-08-24: qty 5

## 2021-08-24 MED ORDER — PROPOFOL 10 MG/ML IV BOLUS
INTRAVENOUS | Status: AC
  Filled 2021-08-24: qty 40

## 2021-08-24 MED ORDER — SODIUM CHLORIDE 0.9 % IR SOLN
Status: DC | PRN
  Administered 2021-08-24: 1000 mL via INTRAVESICAL

## 2021-08-24 MED ORDER — ACETAMINOPHEN 500 MG PO TABS
ORAL_TABLET | ORAL | Status: AC
  Filled 2021-08-24: qty 2

## 2021-08-24 MED ORDER — DEXAMETHASONE SODIUM PHOSPHATE 10 MG/ML IJ SOLN
INTRAMUSCULAR | Status: AC
  Filled 2021-08-24: qty 1

## 2021-08-24 MED ORDER — DEXAMETHASONE SODIUM PHOSPHATE 4 MG/ML IJ SOLN
INTRAMUSCULAR | Status: DC | PRN
  Administered 2021-08-24: 10 mg via INTRAVENOUS

## 2021-08-24 MED ORDER — PHENYLEPHRINE 40 MCG/ML (10ML) SYRINGE FOR IV PUSH (FOR BLOOD PRESSURE SUPPORT)
PREFILLED_SYRINGE | INTRAVENOUS | Status: DC | PRN
  Administered 2021-08-24 (×2): 80 ug via INTRAVENOUS

## 2021-08-24 MED ORDER — IBUPROFEN 200 MG PO TABS: 600.0000 mg | ORAL_TABLET | Freq: Four times a day (QID) | ORAL | Status: DC

## 2021-08-24 MED ORDER — MIDAZOLAM HCL 5 MG/5ML IJ SOLN
INTRAMUSCULAR | Status: DC | PRN
  Administered 2021-08-24: 2 mg via INTRAVENOUS

## 2021-08-24 MED ORDER — ONDANSETRON HCL 4 MG/2ML IJ SOLN
INTRAMUSCULAR | Status: AC
  Filled 2021-08-24: qty 2

## 2021-08-24 MED ORDER — ACETAMINOPHEN 500 MG PO TABS
1000.0000 mg | ORAL_TABLET | Freq: Four times a day (QID) | ORAL | Status: DC
  Administered 2021-08-24 – 2021-08-25 (×4): 1000 mg via ORAL

## 2021-08-24 MED ORDER — OXYCODONE HCL 5 MG PO TABS: 5.0000 mg | ORAL_TABLET | ORAL | Status: DC | PRN

## 2021-08-24 MED ORDER — ESMOLOL HCL 100 MG/10ML IV SOLN
INTRAVENOUS | Status: DC | PRN
  Administered 2021-08-24: 5 mg via INTRAVENOUS

## 2021-08-24 MED ORDER — ONDANSETRON HCL 4 MG PO TABS: 4.0000 mg | ORAL_TABLET | Freq: Four times a day (QID) | ORAL | Status: DC | PRN

## 2021-08-24 MED ORDER — ROCURONIUM BROMIDE 100 MG/10ML IV SOLN
INTRAVENOUS | Status: DC | PRN
  Administered 2021-08-24 (×2): 10 mg via INTRAVENOUS
  Administered 2021-08-24: 60 mg via INTRAVENOUS

## 2021-08-24 MED ORDER — INDIGOTINDISULFONATE SODIUM 8 MG/ML IJ SOLN
INTRAMUSCULAR | Status: DC | PRN
  Administered 2021-08-24: 5 mL via INTRAVENOUS

## 2021-08-24 MED ORDER — KETOROLAC TROMETHAMINE 30 MG/ML IJ SOLN: 30.0000 mg | Freq: Four times a day (QID) | INTRAMUSCULAR | Status: DC

## 2021-08-24 MED ORDER — ESMOLOL HCL 100 MG/10ML IV SOLN
INTRAVENOUS | Status: AC
  Filled 2021-08-24: qty 10

## 2021-08-24 MED ORDER — GABAPENTIN 300 MG PO CAPS
ORAL_CAPSULE | ORAL | Status: AC
  Filled 2021-08-24: qty 1

## 2021-08-24 MED ORDER — PROPOFOL 10 MG/ML IV BOLUS
INTRAVENOUS | Status: DC | PRN
  Administered 2021-08-24: 50 mg via INTRAVENOUS
  Administered 2021-08-24: 200 mg via INTRAVENOUS

## 2021-08-24 MED ORDER — FENTANYL CITRATE (PF) 250 MCG/5ML IJ SOLN
INTRAMUSCULAR | Status: AC
  Filled 2021-08-24: qty 5

## 2021-08-24 MED ORDER — FENTANYL CITRATE (PF) 100 MCG/2ML IJ SOLN
INTRAMUSCULAR | Status: DC | PRN
  Administered 2021-08-24 (×4): 50 ug via INTRAVENOUS
  Administered 2021-08-24: 100 ug via INTRAVENOUS

## 2021-08-24 MED ORDER — LIDOCAINE HCL (CARDIAC) PF 100 MG/5ML IV SOSY
PREFILLED_SYRINGE | INTRAVENOUS | Status: DC | PRN
  Administered 2021-08-24: 60 mg via INTRAVENOUS

## 2021-08-24 MED ORDER — GABAPENTIN 300 MG PO CAPS
300.0000 mg | ORAL_CAPSULE | ORAL | Status: AC
  Administered 2021-08-24: 300 mg via ORAL

## 2021-08-24 MED ORDER — FENTANYL CITRATE (PF) 100 MCG/2ML IJ SOLN: 25.0000 ug | INTRAMUSCULAR | Status: DC | PRN

## 2021-08-24 MED ORDER — ONDANSETRON HCL 4 MG/2ML IJ SOLN
INTRAMUSCULAR | Status: DC | PRN
  Administered 2021-08-24: 4 mg via INTRAVENOUS

## 2021-08-24 MED ORDER — PHENYLEPHRINE HCL (PRESSORS) 10 MG/ML IV SOLN: INTRAVENOUS | Status: DC | PRN

## 2021-08-24 MED ORDER — ROCURONIUM BROMIDE 10 MG/ML (PF) SYRINGE
PREFILLED_SYRINGE | INTRAVENOUS | Status: AC
  Filled 2021-08-24: qty 10

## 2021-08-24 MED ORDER — MIDAZOLAM HCL 2 MG/2ML IJ SOLN
INTRAMUSCULAR | Status: AC
  Filled 2021-08-24: qty 2

## 2021-08-24 MED ORDER — SOD CITRATE-CITRIC ACID 500-334 MG/5ML PO SOLN: 30.0000 mL | ORAL | Status: AC

## 2021-08-24 SURGICAL SUPPLY — 59 items
ADH SKN CLS APL DERMABOND .7 (GAUZE/BANDAGES/DRESSINGS) ×2
APL SRG 38 LTWT LNG FL B (MISCELLANEOUS) ×2
APPLICATOR ARISTA FLEXITIP XL (MISCELLANEOUS) ×3 IMPLANT
BARRIER ADHS 3X4 INTERCEED (GAUZE/BANDAGES/DRESSINGS) IMPLANT
BRR ADH 4X3 ABS CNTRL BYND (GAUZE/BANDAGES/DRESSINGS)
COVER BACK TABLE 60X90IN (DRAPES) ×3 IMPLANT
COVER TIP SHEARS 8 DVNC (MISCELLANEOUS) ×2 IMPLANT
COVER TIP SHEARS 8MM DA VINCI (MISCELLANEOUS) ×2
DECANTER SPIKE VIAL GLASS SM (MISCELLANEOUS) ×3 IMPLANT
DEFOGGER SCOPE WARMER CLEARIFY (MISCELLANEOUS) ×3 IMPLANT
DERMABOND ADVANCED (GAUZE/BANDAGES/DRESSINGS) ×1
DERMABOND ADVANCED .7 DNX12 (GAUZE/BANDAGES/DRESSINGS) ×2 IMPLANT
DRAPE ARM DVNC X/XI (DISPOSABLE) ×8 IMPLANT
DRAPE COLUMN DVNC XI (DISPOSABLE) ×2 IMPLANT
DRAPE DA VINCI XI ARM (DISPOSABLE) ×8
DRAPE DA VINCI XI COLUMN (DISPOSABLE) ×1
DRAPE UTILITY XL STRL (DRAPES) ×3 IMPLANT
DURAPREP 26ML APPLICATOR (WOUND CARE) ×3 IMPLANT
ELECT REM PT RETURN 9FT ADLT (ELECTROSURGICAL) ×3
ELECTRODE REM PT RTRN 9FT ADLT (ELECTROSURGICAL) ×2 IMPLANT
GAUZE 4X4 16PLY ~~LOC~~+RFID DBL (SPONGE) ×6 IMPLANT
GLOVE SURG ENC MOIS LTX SZ6 (GLOVE) ×9 IMPLANT
GLOVE SURG UNDER POLY LF SZ6.5 (GLOVE) ×9 IMPLANT
HEMOSTAT ARISTA ABSORB 3G PWDR (HEMOSTASIS) ×3 IMPLANT
HOLDER FOLEY CATH W/STRAP (MISCELLANEOUS) IMPLANT
IRRIG SUCT STRYKERFLOW 2 WTIP (MISCELLANEOUS) ×3
IRRIGATION SUCT STRKRFLW 2 WTP (MISCELLANEOUS) ×2 IMPLANT
KIT TURNOVER CYSTO (KITS) ×3 IMPLANT
LEGGING LITHOTOMY PAIR STRL (DRAPES) ×3 IMPLANT
MANIPULATOR ADVINCU DEL 2.5 PL (MISCELLANEOUS) IMPLANT
MANIPULATOR ADVINCU DEL 3.0 PL (MISCELLANEOUS) ×3 IMPLANT
MANIPULATOR ADVINCU DEL 3.5 PL (MISCELLANEOUS) IMPLANT
MANIPULATOR ADVINCU DEL 4.0 PL (MISCELLANEOUS) IMPLANT
NEEDLE INSUFFLATION 120MM (ENDOMECHANICALS) ×3 IMPLANT
OBTURATOR OPTICAL STANDARD 8MM (TROCAR) ×2
OBTURATOR OPTICAL STND 8 DVNC (TROCAR) ×2
OBTURATOR OPTICALSTD 8 DVNC (TROCAR) ×2 IMPLANT
PACK ROBOT WH (CUSTOM PROCEDURE TRAY) ×3 IMPLANT
PACK ROBOTIC GOWN (GOWN DISPOSABLE) ×3 IMPLANT
PACK TRENDGUARD 450 HYBRID PRO (MISCELLANEOUS) ×2 IMPLANT
PAD OB MATERNITY 4.3X12.25 (PERSONAL CARE ITEMS) ×3 IMPLANT
PAD PREP 24X48 CUFFED NSTRL (MISCELLANEOUS) ×3 IMPLANT
POUCH LAPAROSCOPIC INSTRUMENT (MISCELLANEOUS) IMPLANT
PROTECTOR NERVE ULNAR (MISCELLANEOUS) ×6 IMPLANT
SEAL CANN UNIV 5-8 DVNC XI (MISCELLANEOUS) ×6 IMPLANT
SEAL XI 5MM-8MM UNIVERSAL (MISCELLANEOUS) ×9
SET IRRIG Y TYPE TUR BLADDER L (SET/KITS/TRAYS/PACK) ×3 IMPLANT
SET TRI-LUMEN FLTR TB AIRSEAL (TUBING) ×3 IMPLANT
SPONGE T-LAP 4X18 ~~LOC~~+RFID (SPONGE) ×3 IMPLANT
SUT VIC AB 0 CT1 36 (SUTURE) ×3 IMPLANT
SUT VICRYL RAPIDE 3 0 (SUTURE) ×6 IMPLANT
SUT VLOC 180 0 9IN  GS21 (SUTURE) ×1
SUT VLOC 180 0 9IN GS21 (SUTURE) ×2 IMPLANT
TOWEL OR 17X26 10 PK STRL BLUE (TOWEL DISPOSABLE) ×3 IMPLANT
TRAY FOLEY W/BAG SLVR 14FR LF (SET/KITS/TRAYS/PACK) ×3 IMPLANT
TRENDGUARD 450 HYBRID PRO PACK (MISCELLANEOUS) ×3
TROCAR BLADELESS OPT 5 100 (ENDOMECHANICALS) IMPLANT
TROCAR PORT AIRSEAL 5X120 (TROCAR) ×3 IMPLANT
WATER STERILE IRR 1000ML POUR (IV SOLUTION) ×3 IMPLANT

## 2021-08-24 NOTE — Transfer of Care (Signed)
Immediate Anesthesia Transfer of Care Note  Patient: Lisa Shepard  Procedure(s) Performed: Procedure(s) (LRB): XI ROBOTIC ASSISTED TOTAL HYSTERECTOMY WITH BILATERAL SALPINGectomy (N/A) CYSTOSCOPY (N/A)  Patient Location: PACU  Anesthesia Type: General  Level of Consciousness: awake, sedated, patient cooperative and responds to stimulation  Airway & Oxygen Therapy: Patient Spontanous Breathing and Patient connected to face mask oxygen  Post-op Assessment: Report given to PACU RN, Post -op Vital signs reviewed and stable and Patient moving all extremities  Post vital signs: Reviewed and stable  Complications: No apparent anesthesia complications

## 2021-08-24 NOTE — Anesthesia Procedure Notes (Signed)
Procedure Name: Intubation Date/Time: 08/24/2021 7:37 AM Performed by: Justice Rocher, CRNA Pre-anesthesia Checklist: Patient identified, Emergency Drugs available, Suction available, Patient being monitored and Timeout performed Patient Re-evaluated:Patient Re-evaluated prior to induction Oxygen Delivery Method: Circle system utilized Preoxygenation: Pre-oxygenation with 100% oxygen Induction Type: IV induction Ventilation: Mask ventilation without difficulty Laryngoscope Size: Mac and 3 Grade View: Grade II Tube type: Oral Tube size: 7.0 mm Number of attempts: 1 Airway Equipment and Method: Stylet and Oral airway Placement Confirmation: ETT inserted through vocal cords under direct vision, positive ETCO2, breath sounds checked- equal and bilateral and CO2 detector Secured at: 23 cm Tube secured with: Tape Dental Injury: Teeth and Oropharynx as per pre-operative assessment

## 2021-08-24 NOTE — Brief Op Note (Signed)
08/24/2021  9:31 AM  PATIENT:  Lisa Shepard  45 y.o. female  PRE-OPERATIVE DIAGNOSIS:  menorrhagia, multifibroid uterus  POST-OPERATIVE DIAGNOSIS:  menorrhagia, multifibroid uterus  PROCEDURE:  Procedure(s): XI ROBOTIC ASSISTED TOTAL HYSTERECTOMY WITH BILATERAL SALPINGectomy (N/A) CYSTOSCOPY (N/A)  SURGEON:  Surgeon(s) and Role:    * Rowland Lathe, MD - Primary    * Bobbye Charleston, MD - Assisting   ANESTHESIA:   general  EBL:  15cc  BLOOD ADMINISTERED:none  DRAINS: Urinary Catheter (Foley)   LOCAL MEDICATIONS USED:  NONE  SPECIMEN:  Source of Specimen:  uterus, cervix, bilateral fallopian tubes  DISPOSITION OF SPECIMEN:  PATHOLOGY  COUNTS:  YES  TOURNIQUET:  * No tourniquets in log *  DICTATION: .Note written in EPIC  PLAN OF CARE: Discharge to home after PACU if meeting milestones  PATIENT DISPOSITION:  PACU - hemodynamically stable.   Delay start of Pharmacological VTE agent (>24hrs) due to surgical blood loss or risk of bleeding: not applicable  Irene Pap, MD 08/24/21 9:33 AM

## 2021-08-24 NOTE — Op Note (Signed)
PATIENT:  Lisa Shepard  45 y.o. female   PRE-OPERATIVE DIAGNOSIS:  menorrhagia, multifibroid uterus   POST-OPERATIVE DIAGNOSIS:  menorrhagia, multifibroid uterus   PROCEDURE:  Procedure(s): XI ROBOTIC ASSISTED TOTAL HYSTERECTOMY WITH BILATERAL SALPINGectomy (N/A) CYSTOSCOPY (N/A)   SURGEON:  Surgeon(s) and Role:    * Rowland Lathe, MD - Primary    * Bobbye Charleston, MD - Assisting     ANESTHESIA:   general   EBL:  15cc   BLOOD ADMINISTERED:none   DRAINS: Urinary Catheter (Foley)    LOCAL MEDICATIONS USED:  NONE   SPECIMEN:  Source of Specimen:  uterus, cervix, bilateral fallopian tubes   DISPOSITION OF SPECIMEN:  PATHOLOGY   COUNTS:  YES   PLAN OF CARE: Discharge to home after PACU if meeting milestones   PATIENT DISPOSITION:  PACU - hemodynamically stable.   FINDINGS:  On bimanual exam, irregular enlarged uterus approximately 10 week size. Paragard IUD removed with one arm missing, which was found and removed from the uterus specimen after hysterectomy. On laparoscopic view, uterus enlarged with multiple visible subserosal and intramural fibroids. Bilateral fallopian tubes and ovaries normal appearing. Normal appearing ureters bilaterally. Grossly normal upper abdomen. Cystoscopy demonstrated intact bladder and bilateral ureteral jets.    DESCRIPTION OF PROCEDURE: After consent was verified the patient was taken to the operating room.  After adequate anesthesia was achieved the patient was positioned in the dorsal lithotomy position with legs in stirrups. SCDs were in place and cycling.  Exam revealed an irregular enlarged uterus approximately 10 week size. The patient was prepped and draped in normal sterile fashion. Ancef 2g was given for infection prophylaxis. A speculum was placed in the vagina and the anterior lip of the cervix was grasped with a tenaculum. The Paragard IUD was removed and noted to have one arm missing. A stay suture was placed on the  anterior lip of the cervix. The uterus was sounded to 9cm and the cervix dilated with [facility] dilators.  The 3.0 cm delineating ring was assembled and placed in the proper fashion.  The speculum was removed and the bladder was drained with a Foley catheter.   Attention was turned to the abdomen. An 38mm incision was made 1 cm above the umbilicus and the Veress needle was inserted through the incision. Correct placement was confirmed by opening pressure of 50mmHg. The abdomen was insufflated with CO2 gas to a total pressure of 92mmHg. The 8 mm robotic trochar was inserted into the abdominal cavity under direct laparoscopic visualization. Intraabdominal inspection confirmed no vascular or visceral trauma from entry. The patient was placed in steep Trendelenburg.  Two additional 5mm robotic trochars were placed, one on each side of the abdomen. An 56mm Airseal assistant port was placed on the left side. All trochars were inserted under direct visualization of the camera.  The robot was docked.  The fenestrated bipolar was placed on arm 1 and the monopolar scissors on arm 3 and introduced under direct visualization of the camera.   Survey of the abdomen revealed findings as noted above. Bilateral  ureters were visualized peristalsing. The left mesosalpinx was serially ligated to free the left fallopian tube which was removed through the Airseal port. The same was repeated on the right side. The left round ligament was transected.  The vesicouterine peritoneum was opened and the bladder was dissected off the lower uterine segment and upper vagina.  The same was repeated on the right side. The left posterior broad ligament was skeletonized. The  same process was repeated on the right side. The left uterine vessels were dessicated with bipolar cautery and transected with monopolar scissors. The right uterine vessels were transected in the same fashion. The cervicovaginal junction was identified with the delineating ring and  the monopolar scissors were used to enter the vagina and incise circumferentially around the cervicovaginal junction. The uterus, cervix, and bilateral fallopian tube were removed through the vagina and sent for pathology. In inspection of the specimen, a small portion of the the cervix was transected. This portion was identified and transected from the vaginal cuff, then removed through the vagina.  The vaginal cuff was closed with V loc suture in a running fashion. The suture was removed. An assistant palpated the vaginal cuff from the vaginal field and confirmed the vaginal cuff was intact without defect. The abdomen was irrigated and suctioned. Arista was applied to the vaginal cuff which was oozing slightly. The pelvis was observed to be hemostatic at 67mmHg and 74mmHg. The robot was undocked and Ropivicaine was introduced into the pelvis. All ports were removed. Indigo carmine was administered.    The port sites were closed with 3-0 vicryl in a subcuticular fashion and covered with skin glue.  The foley catheter was removed. A cystoscope was inserted into the bladder and the bladder was distended with saline. Survey revealed intact bladder and bilateral ureteral jets were appreciated. The bladder was drained and foley reintroduced. A speculum was inserted into the vagina and vaginal cuff appeared intact and hemostatic. A manual exam again confirmed the vaginal cuff was intact without defect.  The uterus specimen was cut in half and the retained Paragard IUD arm was identified.     The patient was awakened from anesthesia and taken to the recovery room in stable condition.  Irene Pap, MD  08/24/21 9:40 AM

## 2021-08-25 ENCOUNTER — Encounter (HOSPITAL_BASED_OUTPATIENT_CLINIC_OR_DEPARTMENT_OTHER): Payer: Self-pay | Admitting: Obstetrics and Gynecology

## 2021-08-25 DIAGNOSIS — Z885 Allergy status to narcotic agent status: Secondary | ICD-10-CM | POA: Diagnosis not present

## 2021-08-25 DIAGNOSIS — N920 Excessive and frequent menstruation with regular cycle: Secondary | ICD-10-CM | POA: Diagnosis not present

## 2021-08-25 DIAGNOSIS — D259 Leiomyoma of uterus, unspecified: Secondary | ICD-10-CM | POA: Diagnosis not present

## 2021-08-25 DIAGNOSIS — N888 Other specified noninflammatory disorders of cervix uteri: Secondary | ICD-10-CM | POA: Diagnosis not present

## 2021-08-25 LAB — CBC WITH DIFFERENTIAL/PLATELET
Abs Immature Granulocytes: 0.07 10*3/uL (ref 0.00–0.07)
Basophils Absolute: 0 10*3/uL (ref 0.0–0.1)
Basophils Relative: 0 %
Eosinophils Absolute: 0 10*3/uL (ref 0.0–0.5)
Eosinophils Relative: 0 %
HCT: 27 % — ABNORMAL LOW (ref 36.0–46.0)
Hemoglobin: 8.3 g/dL — ABNORMAL LOW (ref 12.0–15.0)
Immature Granulocytes: 1 %
Lymphocytes Relative: 13 %
Lymphs Abs: 1.8 10*3/uL (ref 0.7–4.0)
MCH: 22.8 pg — ABNORMAL LOW (ref 26.0–34.0)
MCHC: 30.7 g/dL (ref 30.0–36.0)
MCV: 74.2 fL — ABNORMAL LOW (ref 80.0–100.0)
Monocytes Absolute: 0.8 10*3/uL (ref 0.1–1.0)
Monocytes Relative: 6 %
Neutro Abs: 11.5 10*3/uL — ABNORMAL HIGH (ref 1.7–7.7)
Neutrophils Relative %: 80 %
Platelets: 415 10*3/uL — ABNORMAL HIGH (ref 150–400)
RBC: 3.64 MIL/uL — ABNORMAL LOW (ref 3.87–5.11)
RDW: 18.5 % — ABNORMAL HIGH (ref 11.5–15.5)
WBC: 14.1 10*3/uL — ABNORMAL HIGH (ref 4.0–10.5)
nRBC: 0 % (ref 0.0–0.2)

## 2021-08-25 MED ORDER — KETOROLAC TROMETHAMINE 30 MG/ML IJ SOLN
INTRAMUSCULAR | Status: AC
  Filled 2021-08-25: qty 1

## 2021-08-25 MED ORDER — ACETAMINOPHEN 500 MG PO TABS
ORAL_TABLET | ORAL | Status: AC
  Filled 2021-08-25: qty 2

## 2021-08-25 NOTE — Discharge Summary (Signed)
Physician Discharge Summary  Patient ID: Lisa Shepard MRN: [id number] DOB/AGE: Mar 22, 1976 45 y.o.  Admit date: 08/24/2021 Discharge date: 08/25/2021  Admission Diagnoses: Scheduled hysterectomy for multifibroid uterus and menorrhagia  Discharge Diagnoses:  Active Problems:   H/O total hysterectomy   Discharged Condition: good  Hospital Course:  10/26 admitted for scheduled surgery. Underwent uncomplicated robot assisted total laparoscopic hysterectomy, bilateral salpingectomy, cystoscopy. Remained overnight for extended recovery 10/27: POD1, meeting postpartum milestones, postop appropriate at Hgb 8.3  Consults: None  Significant Diagnostic Studies: labs: Hgb 8.3  Treatments: surgery: robot assisted total laparoscopic hysterectomy, bilateral salpingectomy, cystoscopy  Discharge Exam: Blood pressure (!) 141/93, pulse (!) 103, temperature 98.8 F (37.1 C), resp. rate 16, height 4\' 11"  (1.499 m), weight 83.5 kg, last menstrual period 08/11/2021, SpO2 99 %. Gen: well appearing, sitting in bed eating oatmeal CVS: tachycardic low 100s Resp: nonlabored Abd: soft, minimally distended, appropriately tender. Lap sites c/d/I x 4 Ext: no calf edema or tenderness  Disposition: Discharge disposition: 01-Home or Self Care      Discharge Instructions     Call MD for:  difficulty breathing, headache or visual disturbances   Complete by: As directed    Call MD for:  extreme fatigue   Complete by: As directed    Call MD for:  hives   Complete by: As directed    Call MD for:  persistant dizziness or light-headedness   Complete by: As directed    Call MD for:  persistant nausea and vomiting   Complete by: As directed    Call MD for:  redness, tenderness, or signs of infection (pain, swelling, redness, odor or green/yellow discharge around incision site)   Complete by: As directed    Call MD for:  severe uncontrolled pain   Complete by: As directed    Call MD for:  temperature  >100.4   Complete by: As directed    Diet - low sodium heart healthy   Complete by: As directed    Driving Restrictions   Complete by: As directed    No driving while taking narcotics   Increase activity slowly   Complete by: As directed    Lifting restrictions   Complete by: As directed    No heaving lifting more than 10lbs   No dressing needed   Complete by: As directed    Sexual Activity Restrictions   Complete by: As directed    Nothing in vagina for at least 6 weeks      Allergies as of 08/25/2021       Reactions   Codeine Nausea And Vomiting   Other    Fuzzy fruits strawberry, peaches, kiwi cause rash on arms        Medication List     STOP taking these medications    paragard intrauterine copper Iud IUD       TAKE these medications    acetaminophen 500 MG tablet Commonly known as: TYLENOL Take 500 mg by mouth every 6 (six) hours as needed. Notes to patient: May take next dose at 1:30 pm   Dilaudid 2 MG tablet Generic drug: HYDROmorphone Take 1 tablet (2 mg total) by mouth every 4 (four) hours as needed for severe pain.   ferrous sulfate 325 (65 FE) MG tablet Take 325 mg by mouth daily with breakfast.   ibuprofen 800 MG tablet Commonly known as: ADVIL Take 1 tablet (800 mg total) by mouth every 8 (eight) hours as needed. Notes to patient: May take  next dose at 2:00 pm   multivitamin capsule Take 1 capsule by mouth daily.   PROBIOTIC PO Take by mouth daily.   VITAMIN C PO Take by mouth daily.   VITAMIN E PO Take by mouth daily.               Discharge Care Instructions  (From admission, onward)           Start     Ordered   08/24/21 0000  No dressing needed        08/24/21 1657            Follow-up Information     Rowland Lathe, MD. Go on 09/09/2021.   Specialty: Obstetrics and Gynecology Contact information: [address] [place] [address] [phone]                  Signed: Rowland Lathe 08/25/2021, 9:37 AM

## 2021-08-25 NOTE — Anesthesia Postprocedure Evaluation (Signed)
Anesthesia Post Note  Patient: [name]  Procedure(s) Performed: XI ROBOTIC ASSISTED TOTAL HYSTERECTOMY WITH BILATERAL SALPINGectomy (Abdomen) CYSTOSCOPY (Bladder)     Patient location during evaluation: PACU Anesthesia Type: General Level of consciousness: awake and alert Pain management: pain level controlled Vital Signs Assessment: post-procedure vital signs reviewed and stable Respiratory status: spontaneous breathing, nonlabored ventilation, respiratory function stable and patient connected to nasal cannula oxygen Cardiovascular status: blood pressure returned to baseline and stable Postop Assessment: no apparent nausea or vomiting Anesthetic complications: no   No notable events documented.  Last Vitals:  Vitals:   08/25/21 0207 08/25/21 0550  BP: (!) 148/71 127/74  Pulse: (!) 120 (!) 108  Resp: 16 16  Temp: 37.1 C 37.5 C  SpO2: 97% 98%    Last Pain:  Vitals:   08/25/21 0550  TempSrc:   PainSc: 3                  Jaise Moser P Torri Langston

## 2021-08-25 NOTE — Progress Notes (Signed)
GYN PROGRESS NOTE  S: Doing well this morning. Reports pain 3/10, controlled with pain regimen. Tolerating PO without nausea/vomiting. Ambulating without dizziness. Voiding. No VB.   Today's Vitals   08/25/21 0207 08/25/21 0457 08/25/21 0550 08/25/21 0724  BP: (!) 148/71  127/74   Pulse: (!) 120  (!) 108   Resp: 16  16   Temp: 98.8 F (37.1 C)  99.5 F (37.5 C)   TempSrc:      SpO2: 97%  98%   Weight:      Height:      PainSc: 3  Asleep 3  3    Body mass index is 37.16 kg/m.  Gen: well appearing, sitting in bed eating oatmeal CVS: tachycardic low 100s Resp: nonlabored Abd: soft, minimally distended, appropriately tender. Lap sites c/d/I x 4 Ext: no calf edema or tenderness  A/P POD1 s/p RA-TLH, b/l salpingectomy, cysto - Pain controlled - Tachycardic overnight, suspect related to pain, but will check CBC this AM - Dispo: anticipate d/c home this morning pending CBC result  M. Brien Mates, MD 08/25/21 8:00 AM

## 2021-08-26 LAB — SURGICAL PATHOLOGY

## 2021-09-05 ENCOUNTER — Other Ambulatory Visit: Payer: Self-pay | Admitting: Family Medicine

## 2021-09-05 DIAGNOSIS — Z1231 Encounter for screening mammogram for malignant neoplasm of breast: Secondary | ICD-10-CM

## 2021-10-17 ENCOUNTER — Ambulatory Visit
Admission: RE | Admit: 2021-10-17 | Discharge: 2021-10-17 | Disposition: A | Discharge status: Home / Self Care | Payer: 59 | Source: Ambulatory Visit | Attending: Family Medicine | Admitting: Family Medicine

## 2021-10-17 DIAGNOSIS — Z1231 Encounter for screening mammogram for malignant neoplasm of breast: Secondary | ICD-10-CM

## 2022-01-31 DIAGNOSIS — Z0000 Encounter for general adult medical examination without abnormal findings: Secondary | ICD-10-CM | POA: Diagnosis not present

## 2022-01-31 DIAGNOSIS — Z0001 Encounter for general adult medical examination with abnormal findings: Secondary | ICD-10-CM | POA: Diagnosis not present

## 2022-01-31 DIAGNOSIS — D649 Anemia, unspecified: Secondary | ICD-10-CM | POA: Diagnosis not present

## 2022-01-31 DIAGNOSIS — Z566 Other physical and mental strain related to work: Secondary | ICD-10-CM | POA: Diagnosis not present

## 2022-01-31 DIAGNOSIS — S161XXA Strain of muscle, fascia and tendon at neck level, initial encounter: Secondary | ICD-10-CM | POA: Diagnosis not present

## 2022-01-31 DIAGNOSIS — Z13220 Encounter for screening for lipoid disorders: Secondary | ICD-10-CM | POA: Diagnosis not present

## 2022-01-31 DIAGNOSIS — Z1329 Encounter for screening for other suspected endocrine disorder: Secondary | ICD-10-CM | POA: Diagnosis not present

## 2022-01-31 DIAGNOSIS — L680 Hirsutism: Secondary | ICD-10-CM | POA: Diagnosis not present

## 2022-01-31 DIAGNOSIS — I10 Essential (primary) hypertension: Secondary | ICD-10-CM | POA: Diagnosis not present

## 2022-01-31 DIAGNOSIS — Z13228 Encounter for screening for other metabolic disorders: Secondary | ICD-10-CM | POA: Diagnosis not present

## 2022-01-31 DIAGNOSIS — Z1389 Encounter for screening for other disorder: Secondary | ICD-10-CM | POA: Diagnosis not present

## 2022-05-30 ENCOUNTER — Other Ambulatory Visit: Payer: Self-pay | Admitting: *Deleted

## 2022-05-30 NOTE — Patient Outreach (Signed)
  Care Management   Outreach Note  05/30/2022  Name: Lisa Shepard MRN: [id number] DOB: 03-08-1976  Reason for Referral: Care Coordination - Assessment of Needs.   An unsuccessful telephone outreach was attempted today. The patient was referred to the case management team for assistance with care management and care coordination. A HIPAA compliant message was left on voicemail for patient, providing contact information for CSW, encouraging patient to return the call at her earliest convenience.   Follow Up Plan:  [facility] will reschedule initial telephone outreach call for patient with CSW.   Nat Christen, BSW, MSW, LCSW  Licensed Education officer, [facility]  Mailing [place] [address] [place], [place] [address] Physical Address-[address] [place], [place] [address] Toll Free Main # [phone] Fax # [phone] Cell # [phone] [email or web address]

## 2022-06-06 ENCOUNTER — Encounter: Payer: Self-pay | Admitting: *Deleted

## 2022-06-06 ENCOUNTER — Ambulatory Visit: Payer: Self-pay | Admitting: *Deleted

## 2022-06-06 NOTE — Patient Instructions (Signed)
Visit Information  Thank you for taking time to visit with me today. Please don't hesitate to contact me if I can be of assistance to you.   Please call the care guide team at [phone] if you need to cancel or reschedule your appointment.   If you are experiencing a Mental Health or Behavioral Health Crisis or need someone to talk to, please call the Suicide and Crisis Lifeline: 988 call the [place] National Suicide Prevention Lifeline: 1-800-273-8255 or TTY: 1-800-799-4 TTY (1-800-799-4889) to talk to a trained counselor call 1-800-273-TALK (toll free, 24 hour hotline) go to [facility] [address], [place] ([phone]) call the [place] Crisis Line: 800-939-9988 call 911  Patient verbalizes understanding of instructions and care plan provided today and agrees to view in MyChart. Active MyChart status and patient understanding of how to access instructions and care plan via MyChart confirmed with patient.     No further follow up required.  Demoni Parmar, BSW, MSW, LCSW  Licensed Clinical Social Worker  [facility] [place] [facility]  Mailing Address-[address], [place], [place] [address] Physical Address-[address], , [place] [address] Toll Free Main # [phone] Fax # [phone] Cell # [phone] [email or web address]            

## 2022-06-06 NOTE — Patient Outreach (Signed)
  Care Coordination   Initial Visit Note   06/06/2022  Name: Lisa Shepard MRN: [id number] DOB: 02/01/1976  Lisa Shepard is a 46 y.o. year old female who sees Lisa Bill, MD for primary care. I spoke with Lisa Shepard by phone today.  What matters to the patients health and wellness today?  No Intervention Identified. Update Primary Care Provider to Dr. Bartholome Shepard with [facility].   Goals Addressed   None     SDOH assessments and interventions completed:  Yes  SDOH Interventions Today    Flowsheet Row Most Recent Value  SDOH Interventions   Food Insecurity Interventions Intervention Not Indicated  Financial Strain Interventions Intervention Not Indicated  Housing Interventions Intervention Not Indicated  Physical Activity Interventions Patient Refused  Stress Interventions Intervention Not Indicated  Social Connections Interventions Intervention Not Indicated  Transportation Interventions Intervention Not Indicated        Care Coordination Interventions Activated:  Yes   Care Coordination Interventions:  Yes, provided.   Follow up plan: No further intervention required.   Encounter Outcome:  Pt. Visit Completed.   Nat Christen, BSW, MSW, LCSW  Licensed Education officer, [facility]  Mailing [place] [address] [place], [place] [address] Physical Address-[address] [place], [place] [address] Toll Free Main # [phone] Fax # [phone] Cell # [phone] [email or web address]

## 2022-09-06 IMAGING — MG MM DIGITAL SCREENING BILAT W/ TOMO AND CAD
8 series · 8 of 24 positions shown · non-contrast
Comparison: Previous exam(s).

CLINICAL DATA: Screening.

EXAM:
DIGITAL SCREENING BILATERAL MAMMOGRAM WITH TOMOSYNTHESIS AND CAD
TECHNIQUE: Bilateral screening digital craniocaudal and mediolateral oblique
mammograms were obtained. Bilateral screening digital breast
tomosynthesis was performed. The images were evaluated with
computer-aided detection.

[R MLO synth-2D]
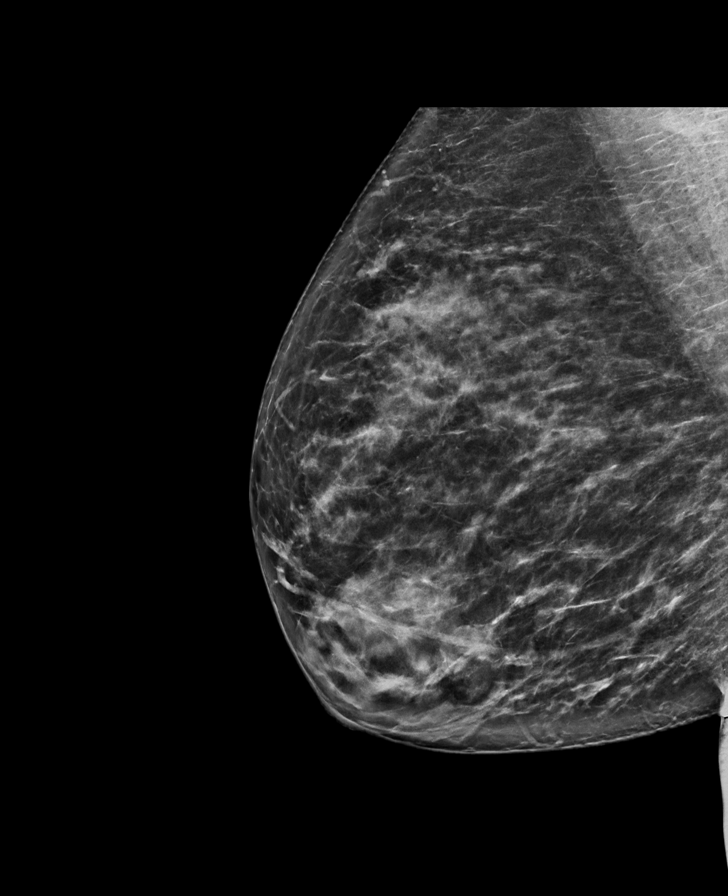

[R CC synth-2D]
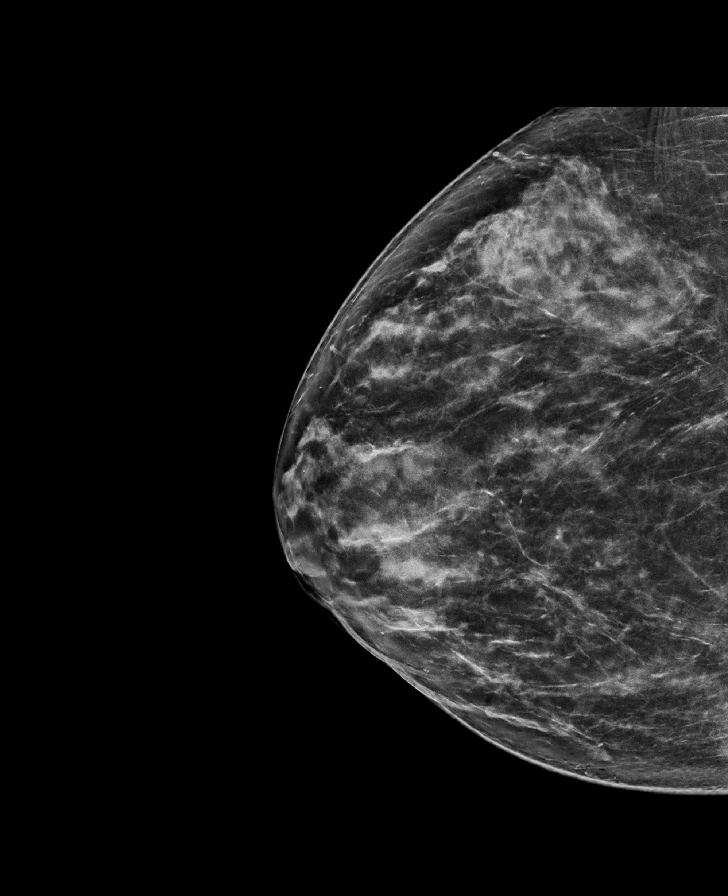

[L CC synth-2D]
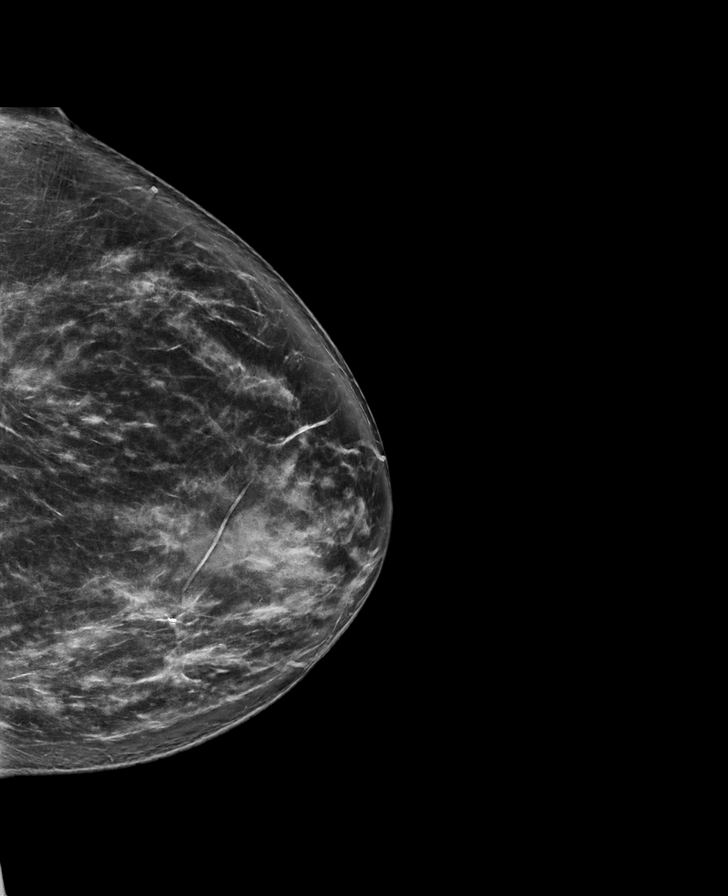

[L MLO synth-2D]
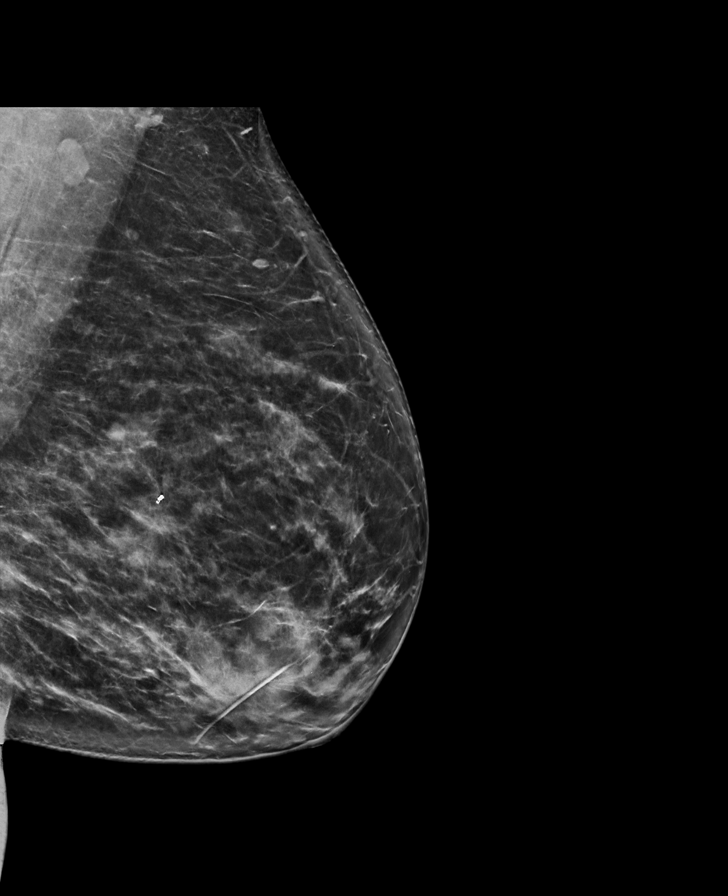

[L MLO tomo · tomo slice 42/83.0]
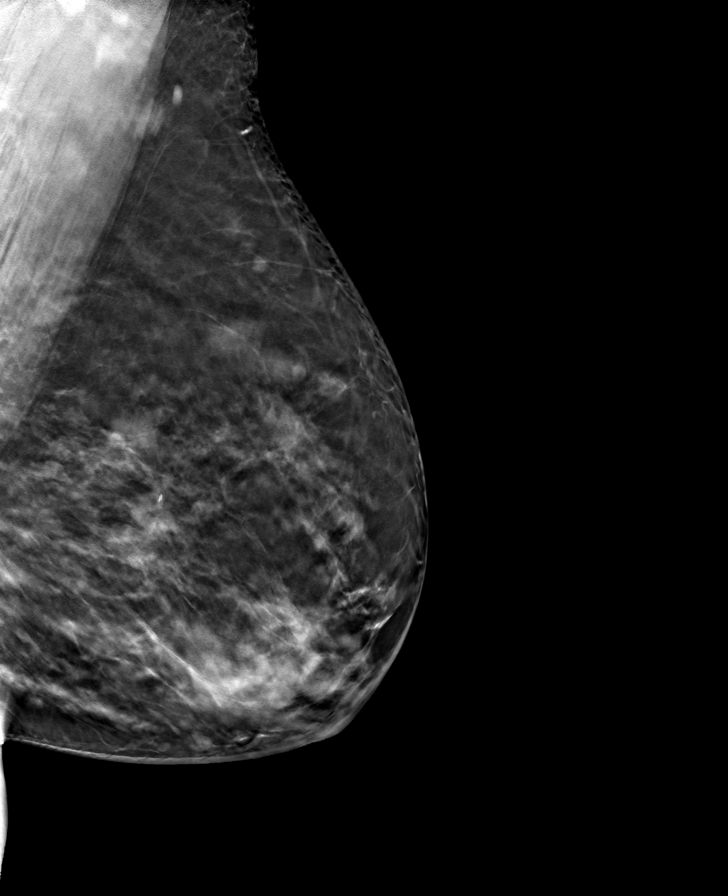

[R CC tomo · tomo slice 43/84.0]
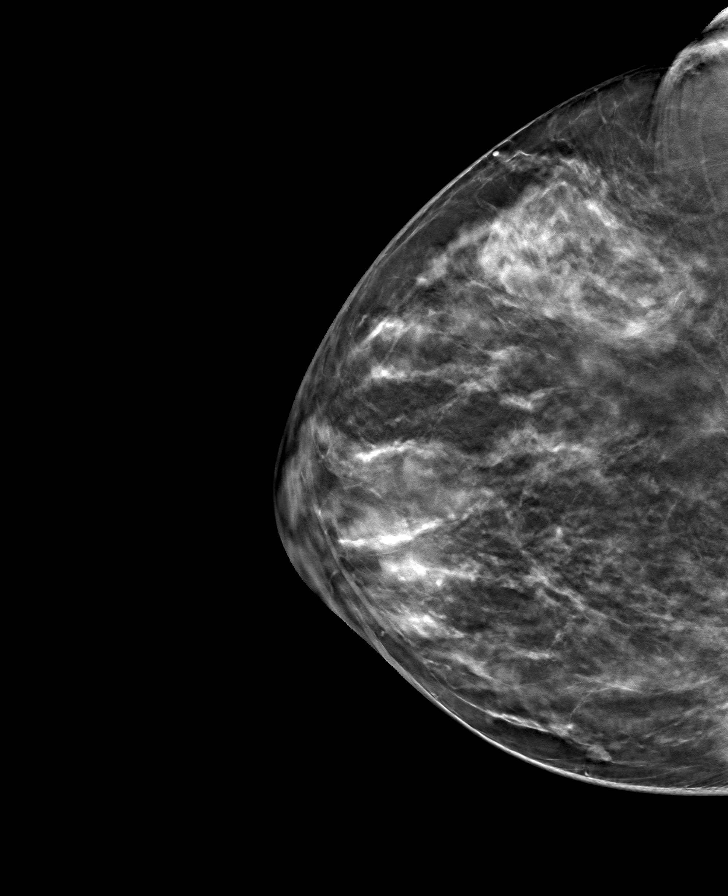

[R MLO tomo · tomo slice 43/86.0]
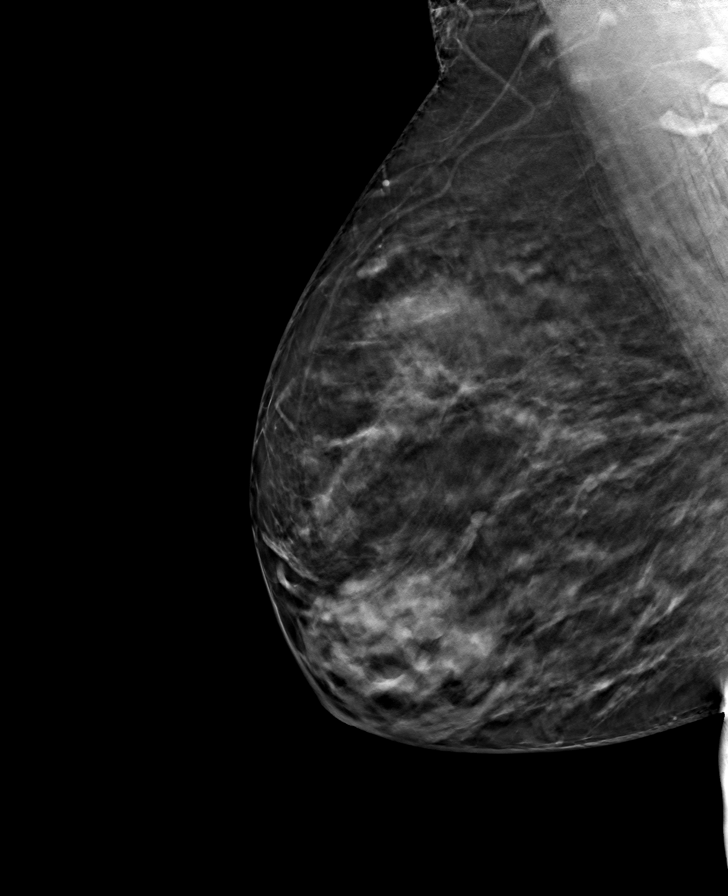

[L CC tomo · tomo slice 44/87.0]
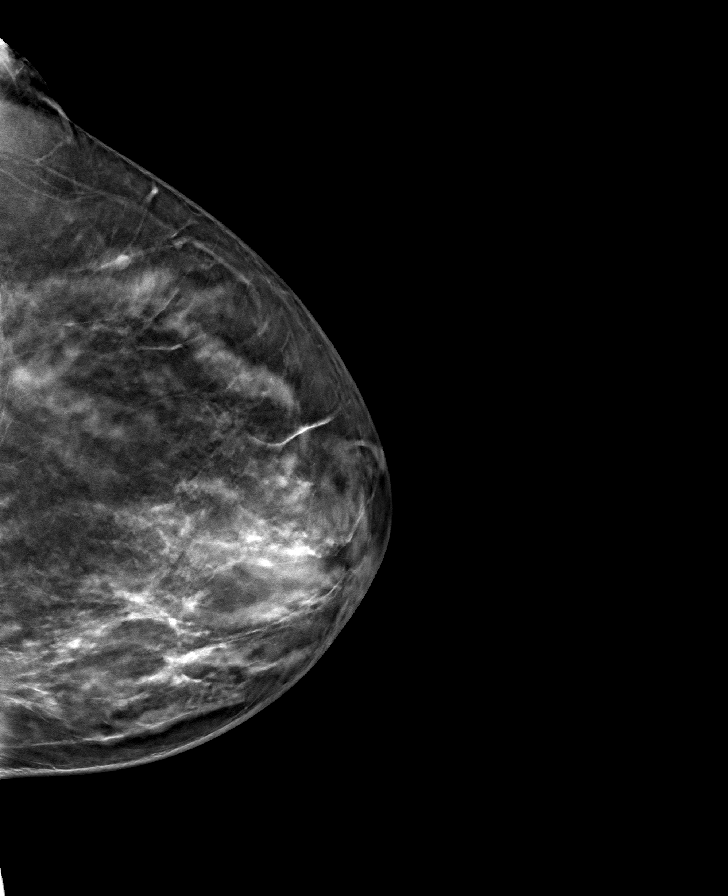

[8 of 24 positions shown; findings below may reference images not displayed]

ACR Breast Density Category c: The breast tissue is heterogeneously
dense, which may obscure small masses.
FINDINGS: There are no findings suspicious for malignancy.
IMPRESSION: No mammographic evidence of malignancy. A result letter of this
screening mammogram will be mailed directly to the patient.

RECOMMENDATION:
Screening mammogram in one year. (Code:Q3-W-BC3)

BI-RADS CATEGORY  1: Negative.

## 2022-10-04 ENCOUNTER — Other Ambulatory Visit: Payer: Self-pay | Admitting: Family Medicine

## 2022-10-04 DIAGNOSIS — Z1231 Encounter for screening mammogram for malignant neoplasm of breast: Secondary | ICD-10-CM

## 2022-11-28 ENCOUNTER — Ambulatory Visit
Admission: RE | Admit: 2022-11-28 | Discharge: 2022-11-28 | Disposition: A | Discharge status: Home / Self Care | Payer: Managed Care, Other (non HMO) | Source: Ambulatory Visit | Attending: Family Medicine | Admitting: Family Medicine

## 2022-11-28 DIAGNOSIS — Z1231 Encounter for screening mammogram for malignant neoplasm of breast: Secondary | ICD-10-CM

## 2023-08-02 ENCOUNTER — Encounter: Payer: Self-pay | Admitting: Podiatry

## 2023-08-02 ENCOUNTER — Ambulatory Visit: Payer: Managed Care, Other (non HMO) | Admitting: Podiatry

## 2023-08-02 DIAGNOSIS — B351 Tinea unguium: Secondary | ICD-10-CM

## 2023-08-02 MED ORDER — TERBINAFINE HCL 250 MG PO TABS: 250.0000 mg | ORAL_TABLET | Freq: Every day | ORAL | 0 refills | Status: DC

## 2023-08-02 NOTE — Patient Instructions (Signed)
VISIT SUMMARY:  During your visit, we discussed your concerns about the nail fungus on your right first, fourth, and fifth toenails, likely due to trauma from roller derby. We also reviewed your history of a left ankle fracture, which is currently stable and does not require any intervention.  YOUR PLAN:  -NAIL FUNGUS (ONYCHOMYCOSIS): This is a common condition that begins as a white or yellow spot under the tip of your toenail. As the fungal infection goes deeper, it can cause your nail to discolor, thicken and crumble at the edge. We will start you on a medication called Terbinafine (Lamisil) 250mg  daily for 90 days. This medication is designed to help your body fight off the fungus. You should continue with your pedicures, but avoid cutting the nail back to the loose parts.  -HISTORY OF LEFT ANKLE FRACTURE: Your previous left ankle fracture is currently stable and does not require any intervention at this time.  INSTRUCTIONS:  Take the medication once daily. Please let me know if you develop any side effects. After 90 days of treatment, please return for a follow-up visit in 4 months to assess your response to the treatment.

## 2023-08-02 NOTE — Progress Notes (Signed)
  Subjective:  Patient ID: Lisa Shepard, female    DOB: Aug 05, 1976,  MRN: [id number]  Chief Complaint  Patient presents with   Nail Problem    Right 1st, 5th, and 4th toenails. Reports trauma from rollerskating, somewhat fungal in appearance with darkening over the past year    Discussed the use of AI scribe software for clinical note transcription with the patient, who gave verbal consent to proceed.  History of Present Illness   The patient, with a history of roller derby and a previous left ankle fracture requiring surgical fixation, presents with nail fungus. She reports a history of trauma to the affected nails, which initially turned black and blue and are now brown. The patient denies any history of liver or kidney disease, diabetes, or regular alcohol consumption. She has been managing the nail appearance with pedicures, but has not received any specific treatment for the fungus.          Objective:    Physical Exam   EXTREMITIES: Foot warm, well perfused with palpable plus two DP and PT pulses. Normal capillary fill time. SKIN: Right first, fifth, and fourth toenails exhibit dystrophy, discoloration, subungual debris, and onychomycosis.           Results          Assessment:   1. Onychomycosis      Plan:  Patient was evaluated and treated and all questions answered.  Assessment and Plan    Onychomycosis Noted on her right first, fourth, and fifth toenails, likely secondary to trauma from roller derby, she exhibits no signs of infection or significant discomfort. We will start Terbinafine (Lamisil) 250mg  daily for 90 days. She is advised to continue with pedicures but avoid cutting the nail back to the loose parts. Follow-up in 4 months to assess response to treatment. We discussed possible side effects and she should notify me if these develop.  History of left ankle fracture Stable with no current complaints, no intervention needed at this time.           Return in about 4 months (around 12/03/2023) for follow up after nail fungus treatment.

## 2023-10-19 ENCOUNTER — Other Ambulatory Visit: Payer: Self-pay | Admitting: Family Medicine

## 2023-10-19 DIAGNOSIS — Z1231 Encounter for screening mammogram for malignant neoplasm of breast: Secondary | ICD-10-CM

## 2023-10-27 ENCOUNTER — Other Ambulatory Visit: Payer: Self-pay | Admitting: Podiatry

## 2023-12-17 ENCOUNTER — Ambulatory Visit
Admission: RE | Admit: 2023-12-17 | Discharge: 2023-12-17 | Disposition: A | Discharge status: Home / Self Care | Payer: 59 | Source: Ambulatory Visit | Attending: Family Medicine | Admitting: Family Medicine

## 2023-12-17 DIAGNOSIS — Z1231 Encounter for screening mammogram for malignant neoplasm of breast: Secondary | ICD-10-CM

## 2023-12-21 ENCOUNTER — Other Ambulatory Visit: Payer: Self-pay | Admitting: Family Medicine

## 2023-12-21 DIAGNOSIS — R928 Other abnormal and inconclusive findings on diagnostic imaging of breast: Secondary | ICD-10-CM

## 2024-01-03 ENCOUNTER — Ambulatory Visit
Admission: RE | Admit: 2024-01-03 | Discharge: 2024-01-03 | Disposition: A | Discharge status: Home / Self Care | Payer: 59 | Source: Ambulatory Visit | Attending: Family Medicine | Admitting: Family Medicine

## 2024-01-03 DIAGNOSIS — R928 Other abnormal and inconclusive findings on diagnostic imaging of breast: Secondary | ICD-10-CM

## 2024-01-07 ENCOUNTER — Other Ambulatory Visit: Payer: 59

## 2024-02-03 ENCOUNTER — Other Ambulatory Visit: Payer: Self-pay | Admitting: Podiatry

## 2024-11-14 ENCOUNTER — Other Ambulatory Visit: Payer: Self-pay | Admitting: Family Medicine

## 2024-11-14 DIAGNOSIS — Z1231 Encounter for screening mammogram for malignant neoplasm of breast: Secondary | ICD-10-CM

## 2024-12-17 ENCOUNTER — Ambulatory Visit
